# Patient Record
Sex: Female | Born: 1980 | Marital: Married | Smoking: Never smoker
Health system: Southern US, Community
[De-identification: ages and names within clinical notes are randomized; demographics above are authoritative.]

## PROBLEM LIST (undated history)

## (undated) DIAGNOSIS — E119 Type 2 diabetes mellitus without complications: Secondary | ICD-10-CM

## (undated) DIAGNOSIS — K7689 Other specified diseases of liver: Secondary | ICD-10-CM

## (undated) HISTORY — PX: HYSTERECTOMY: SHX81

## (undated) HISTORY — DX: Other specified diseases of liver: K76.89

---

## 2022-02-15 ENCOUNTER — Encounter: Payer: Self-pay | Admitting: Internal Medicine

## 2022-02-15 ENCOUNTER — Ambulatory Visit (INDEPENDENT_AMBULATORY_CARE_PROVIDER_SITE_OTHER): Payer: Medicaid Other | Admitting: Internal Medicine

## 2022-02-15 ENCOUNTER — Other Ambulatory Visit: Payer: Self-pay

## 2022-02-15 ENCOUNTER — Ambulatory Visit (LOCAL_COMMUNITY_HEALTH_CENTER): Payer: Medicaid Other

## 2022-02-15 VITALS — BP 98/64 | HR 76 | Temp 97.6°F | Ht 62.21 in | Wt 209.4 lb

## 2022-02-15 DIAGNOSIS — N939 Abnormal uterine and vaginal bleeding, unspecified: Secondary | ICD-10-CM

## 2022-02-15 DIAGNOSIS — Z131 Encounter for screening for diabetes mellitus: Secondary | ICD-10-CM

## 2022-02-15 DIAGNOSIS — Z136 Encounter for screening for cardiovascular disorders: Secondary | ICD-10-CM | POA: Diagnosis not present

## 2022-02-15 DIAGNOSIS — Z23 Encounter for immunization: Secondary | ICD-10-CM

## 2022-02-15 DIAGNOSIS — R7989 Other specified abnormal findings of blood chemistry: Secondary | ICD-10-CM

## 2022-02-15 DIAGNOSIS — Z1329 Encounter for screening for other suspected endocrine disorder: Secondary | ICD-10-CM | POA: Diagnosis not present

## 2022-02-15 DIAGNOSIS — Z719 Counseling, unspecified: Secondary | ICD-10-CM

## 2022-02-15 LAB — URINALYSIS, ROUTINE W REFLEX MICROSCOPIC
Bilirubin, UA: NEGATIVE
Ketones, UA: NEGATIVE
Leukocytes,UA: NEGATIVE
Nitrite, UA: NEGATIVE
Protein,UA: NEGATIVE
RBC, UA: NEGATIVE
Specific Gravity, UA: 1.015 (ref 1.005–1.030)
Urobilinogen, Ur: 0.2 mg/dL (ref 0.2–1.0)
pH, UA: 5.5 (ref 5.0–7.5)

## 2022-02-15 LAB — BAYER DCA HB A1C WAIVED: HB A1C (BAYER DCA - WAIVED): 8.8 % — ABNORMAL HIGH (ref 4.8–5.6)

## 2022-02-15 MED ORDER — METFORMIN HCL 500 MG PO TABS: 1000.0000 mg | ORAL_TABLET | Freq: Two times a day (BID) | ORAL | 3 refills | Status: DC

## 2022-02-15 MED ORDER — EMPAGLIFLOZIN 10 MG PO TABS: 10.0000 mg | ORAL_TABLET | Freq: Every day | ORAL | 6 refills | Status: DC

## 2022-02-15 NOTE — Patient Instructions (Addendum)
BMI for Adults What is BMI? Body mass index (BMI) is a number that is calculated from a person's weight and height. BMI can help estimate how much of a person's weight is composed of fat. BMI does not measure body fat directly. Rather, it is an alternative to procedures that directly measure body fat, which can be difficult and expensive. BMI can help identify people who may be at higher risk for certain medical problems. What are BMI measurements used for? BMI is used as a screening tool to identify possible weight problems. It helps determine whether a person is obese, overweight, a healthy weight, or underweight. BMI is useful for: Identifying a weight problem that may be related to a medical condition or may increase the risk for medical problems. Promoting changes, such as changes in diet and exercise, to help reach a healthy weight. BMI screening can be repeated to see if these changes are working. How is BMI calculated? BMI involves measuring your weight in relation to your height. Both height and weight are measured, and the BMI is calculated from those numbers. This can be done either in [place] ([place] or metric measurements. Note that charts and online BMI calculators are available to help you find your BMI quickly and easily without having to do these calculations yourself. To calculate your BMI in [place] ([place] measurements:  Measure your weight in pounds (lb). Multiply the number of pounds by 703. For example, for a person who weighs 180 lb, multiply that number by 703, which equals 126,540. Measure your height in inches. Then multiply that number by itself to get a measurement called "inches squared." For example, for a person who is 70 inches tall, the "inches squared" measurement is 70 inches x 70 inches, which equals 4,900 inches squared. Divide the total from step 2 (number of lb x 703) by the total from step 3 (inches squared): 126,540  4,900 = 25.8. This is your BMI. To  calculate your BMI in metric measurements: Measure your weight in kilograms (kg). Measure your height in meters (m). Then multiply that number by itself to get a measurement called "meters squared." For example, for a person who is 1.75 m tall, the "meters squared" measurement is 1.75 m x 1.75 m, which is equal to 3.1 meters squared. Divide the number of kilograms (your weight) by the meters squared number. In this example: 70  3.1 = 22.6. This is your BMI. What do the results mean? BMI charts are used to identify whether you are underweight, normal weight, overweight, or obese. The following guidelines will be used: Underweight: BMI less than 18.5. Normal weight: BMI between 18.5 and 24.9. Overweight: BMI between 25 and 29.9. Diabetes Mellitus and Exercise Exercising regularly is important for overall health, especially for people who have diabetes mellitus. Exercising is not only about losing weight. It has many other health benefits, such as increasing muscle strength and bone density and reducing body fat and stress. This leads to improved fitness, flexibility, and endurance, all of which result in better overall health. What are the benefits of exercise if I have diabetes? Exercise has many benefits for people with diabetes. They include: Helping to lower and control blood sugar (glucose). Helping the body to respond better to the hormone insulin by improving insulin sensitivity. Reducing how much insulin the body needs. Lowering the risk for heart disease by: Lowering "bad" cholesterol and triglyceride levels. Increasing "good" cholesterol levels. Lowering blood pressure. Lowering blood glucose levels. What is my activity plan?  Your health care provider or certified diabetes educator can help you make a plan for the type and frequency of exercise that works for you. This is called your activity plan. Be sure to: Get at least 150 minutes of medium-intensity or high-intensity exercise  each week. Exercises may include brisk walking, biking, or water aerobics. Do stretching and strengthening exercises, such as yoga or weight lifting, at least 2 times a week. Spread out your activity over at least 3 days of the week. Get some form of physical activity each day. Do not go more than 2 days in a row without some kind of physical activity. Avoid being inactive for more than 90 minutes at a time. Take frequent breaks to walk or stretch. Choose exercises or activities that you enjoy. Set realistic goals. Start slowly and gradually increase your exercise intensity over time. How do I manage my diabetes during exercise? Monitor your blood glucose Check your blood glucose before and after exercising. If your blood glucose is: 240 mg/dL (13.3 mmol/L) or higher before you exercise, check your urine for ketones. These are chemicals created by the liver. If you have ketones in your urine, do not exercise until your blood glucose returns to normal. 100 mg/dL (5.6 mmol/L) or lower, eat a snack containing 15-20 grams of carbohydrate. Check your blood glucose 15 minutes after the snack to make sure that your glucose level is above 100 mg/dL (5.6 mmol/L) before you start your exercise. Know the symptoms of low blood glucose (hypoglycemia) and how to treat it. Your risk for hypoglycemia increases during and after exercise. Follow these tips and your health care provider's instructions Keep a carbohydrate snack that is fast-acting for use before, during, and after exercise to help prevent or treat hypoglycemia. Avoid injecting insulin into areas of the body that are going to be exercised. For example, avoid injecting insulin into: Your arms, when you are about to play tennis. Your legs, when you are about to go jogging. Keep records of your exercise habits. Doing this can help you and your health care provider adjust your diabetes management plan as needed. Write down: Food that you eat before and  after you exercise. Blood glucose levels before and after you exercise. The type and amount of exercise you have done. Work with your health care provider when you start a new exercise or activity. He or she may need to: Make sure that the activity is safe for you. Adjust your insulin, other medicines, and food that you eat. Drink plenty of water while you exercise. This prevents loss of water (dehydration) and problems caused by a lot of heat in the body (heat stroke). Where to find more information American Diabetes Association: www.diabetes.org Summary Exercising regularly is important for overall health, especially for people who have diabetes mellitus. Exercising has many health benefits. It increases muscle strength and bone density and reduces body fat and stress. It also lowers and controls blood glucose. Your health care provider or certified diabetes educator can help you make an activity plan for the type and frequency of exercise that works for you. Work with your health care provider to make sure any new activity is safe for you. Also work with your health care provider to adjust your insulin, other medicines, and the food you eat. This information is not intended to replace advice given to you by your health care provider. Make sure you discuss any questions you have with your health care provider. Document Revised: 09/07/2019 Document Reviewed:  09/07/2019 Elsevier Patient Education  2022 [place] for Diabetes Mellitus, Adult Carbohydrate counting is a method of keeping track of how many carbohydrates you eat. Eating carbohydrates increases the amount of sugar (glucose) in the blood. Counting how many carbohydrates you eat improves how well you manage your blood glucose. This, in turn, helps you manage your diabetes. Carbohydrates are measured in grams (g) per serving. It is important to know how many carbohydrates (in grams or by serving size) you can  have in each meal. This is different for every person. A dietitian can help you make a meal plan and calculate how many carbohydrates you should have at each meal and snack. What foods contain carbohydrates? Carbohydrates are found in the following foods: Grains, such as breads and cereals. Dried beans and soy products. Starchy vegetables, such as potatoes, peas, and corn. Fruit and fruit juices. Milk and yogurt. Sweets and snack foods, such as cake, cookies, candy, chips, and soft drinks. How do I count carbohydrates in foods? There are two ways to count carbohydrates in food. You can read food labels or learn standard serving sizes of foods. You can use either of these methods or a combination of both. Using the Nutrition Facts label The Nutrition Facts list is included on the labels of almost all packaged foods and beverages in the [place]. It includes: The serving size. Information about nutrients in each serving, including the grams of carbohydrate per serving. To use the Nutrition Facts, decide how many servings you will have. Then, multiply the number of servings by the number of carbohydrates per serving. The resulting number is the total grams of carbohydrates that you will be having. Learning the standard serving sizes of foods When you eat carbohydrate foods that are not packaged or do not include Nutrition Facts on the label, you need to measure the servings in order to count the grams of carbohydrates. Measure the foods that you will eat with a food scale or measuring cup, if needed. Decide how many standard-size servings you will eat. Multiply the number of servings by 15. For foods that contain carbohydrates, one serving equals 15 g of carbohydrates. For example, if you eat 2 cups or 10 oz (300 g) of strawberries, you will have eaten 2 servings and 30 g of carbohydrates (2 servings x 15 g = 30 g). For foods that have more than one food mixed, such as soups and casseroles,  you must count the carbohydrates in each food that is included. The following list contains standard serving sizes of common carbohydrate-rich foods. Each of these servings has about 15 g of carbohydrates: 1 slice of bread. 1 six-inch (15 cm) tortilla. ? cup or 2 oz (53 g) cooked rice or pasta.  cup or 3 oz (85 g) cooked or canned, drained and rinsed beans or lentils.  cup or 3 oz (85 g) starchy vegetable, such as peas, corn, or squash.  cup or 4 oz (120 g) hot cereal.  cup or 3 oz (85 g) boiled or mashed potatoes, or  or 3 oz (85 g) of a large baked potato.  cup or 4 fl oz (118 mL) fruit juice. 1 cup or 8 fl oz (237 mL) milk. 1 small or 4 oz (106 g) apple.  or 2 oz (63 g) of a medium banana. 1 cup or 5 oz (150 g) strawberries. 3 cups or 1 oz (28.3 g) popped popcorn. What is an example of carbohydrate counting? To calculate the grams  of carbohydrates in this sample meal, follow the steps shown below. Sample meal 3 oz (85 g) chicken breast. ? cup or 4 oz (106 g) brown rice.  cup or 3 oz (85 g) corn. 1 cup or 8 fl oz (237 mL) milk. 1 cup or 5 oz (150 g) strawberries with sugar-free whipped topping. Carbohydrate calculation Identify the foods that contain carbohydrates: Rice. Corn. Milk. Strawberries. Calculate how many servings you have of each food: 2 servings rice. 1 serving corn. 1 serving milk. 1 serving strawberries. Multiply each number of servings by 15 g: 2 servings rice x 15 g = 30 g. 1 serving corn x 15 g = 15 g. 1 serving milk x 15 g = 15 g. 1 serving strawberries x 15 g = 15 g. Add together all of the amounts to find the total grams of carbohydrates eaten: 30 g + 15 g + 15 g + 15 g = 75 g of carbohydrates total. What are tips for following this plan? Shopping Develop a meal plan and then make a shopping list. Buy fresh and frozen vegetables, fresh and frozen fruit, dairy, eggs, beans, lentils, and whole grains. Look at food labels. Choose foods that have  more fiber and less sugar. Avoid processed foods and foods with added sugars. Meal planning Aim to have the same number of grams of carbohydrates at each meal and for each snack time. Plan to have regular, balanced meals and snacks. Where to find more information American Diabetes Association: diabetes.org Centers for Disease Control and Prevention: Store[employer]com.[place] Academy of Nutrition and Dietetics: eatright.org Association of Diabetes Care & Education Specialists: diabeteseducator.org Summary Carbohydrate counting is a method of keeping track of how many carbohydrates you eat. Eating carbohydrates increases the amount of sugar (glucose) in your blood. Counting how many carbohydrates you eat improves how well you manage your blood glucose. This helps you manage your diabetes. A dietitian can help you make a meal plan and calculate how many carbohydrates you should have at each meal and snack. This information is not intended to replace advice given to you by your health care provider. Make sure you discuss any questions you have with your health care provider. Document Revised: 07/13/2020 Document Reviewed: 07/13/2020 Elsevier Patient Education  [place]. Diabetes Mellitus and Nutrition, Adult When you have diabetes, or diabetes mellitus, it is very important to have healthy eating habits because your blood sugar (glucose) levels are greatly affected by what you eat and drink. Eating healthy foods in the right amounts, at about the same times every day, can help you: Manage your blood glucose. Lower your risk of heart disease. Improve your blood pressure. Reach or maintain a healthy weight. What can affect my meal plan? Every person with diabetes is different, and each person has different needs for a meal plan. Your health care provider may recommend that you work with a dietitian to make a meal plan that is best for you. Your meal plan may vary depending on factors such as: The  calories you need. The medicines you take. Your weight. Your blood glucose, blood pressure, and cholesterol levels. Your activity level. Other health conditions you have, such as heart or kidney disease. How do carbohydrates affect me? Carbohydrates, also called carbs, affect your blood glucose level more than any other type of food. Eating carbs raises the amount of glucose in your blood. It is important to know how many carbs you can safely have in each meal. This is different for every  person. Your dietitian can help you calculate how many carbs you should have at each meal and for each snack. How does alcohol affect me? Alcohol can cause a decrease in blood glucose (hypoglycemia), especially if you use insulin or take certain diabetes medicines by mouth. Hypoglycemia can be a life-threatening condition. Symptoms of hypoglycemia, such as sleepiness, dizziness, and confusion, are similar to symptoms of having too much alcohol. Do not drink alcohol if: Your health care provider tells you not to drink. You are pregnant, may be pregnant, or are planning to become pregnant. If you drink alcohol: Limit how much you have to: 0-1 drink a day for women. 0-2 drinks a day for men. Know how much alcohol is in your drink. In the [place] one drink equals one 12 oz bottle of beer (355 mL), one 5 oz glass of wine (148 mL), or one 1 oz glass of hard liquor (44 mL). Keep yourself hydrated with water, diet soda, or unsweetened iced tea. Keep in mind that regular soda, juice, and other mixers may contain a lot of sugar and must be counted as carbs. What are tips for following this plan? Reading food labels Start by checking the serving size on the Nutrition Facts label of packaged foods and drinks. The number of calories and the amount of carbs, fats, and other nutrients listed on the label are based on one serving of the item. Many items contain more than one serving per package. Check the total grams (g) of  carbs in one serving. Check the number of grams of saturated fats and trans fats in one serving. Choose foods that have a low amount or none of these fats. Check the number of milligrams (mg) of salt (sodium) in one serving. Most people should limit total sodium intake to less than 2,300 mg per day. Always check the nutrition information of foods labeled as "low-fat" or "nonfat." These foods may be higher in added sugar or refined carbs and should be avoided. Talk to your dietitian to identify your daily goals for nutrients listed on the label. Shopping Avoid buying canned, pre-made, or processed foods. These foods tend to be high in fat, sodium, and added sugar. Shop around the outside edge of the grocery store. This is where you will most often find fresh fruits and vegetables, bulk grains, fresh meats, and fresh dairy products. Cooking Use low-heat cooking methods, such as baking, instead of high-heat cooking methods, such as deep frying. Cook using healthy oils, such as olive, canola, or sunflower oil. Avoid cooking with butter, cream, or high-fat meats. Meal planning Eat meals and snacks regularly, preferably at the same times every day. Avoid going long periods of time without eating. Eat foods that are high in fiber, such as fresh fruits, vegetables, beans, and whole grains. Eat 4-6 oz (112-168 g) of lean protein each day, such as lean meat, chicken, fish, eggs, or tofu. One ounce (oz) (28 g) of lean protein is equal to: 1 oz (28 g) of meat, chicken, or fish. 1 egg.  cup (62 g) of tofu. Eat some foods each day that contain healthy fats, such as avocado, nuts, seeds, and fish. What foods should I eat? Fruits Berries. Apples. Oranges. Peaches. Apricots. Plums. Grapes. Mangoes. Papayas. Pomegranates. Kiwi. Cherries. Vegetables Leafy greens, including lettuce, spinach, kale, chard, collard greens, mustard greens, and cabbage. Beets. Cauliflower. Broccoli. Carrots. Green beans. Tomatoes.  Peppers. Onions. Cucumbers. Brussels sprouts. Grains Whole grains, such as whole-wheat or whole-grain bread, crackers, tortillas, cereal, and  pasta. Unsweetened oatmeal. Quinoa. Brown or wild rice. Meats and other proteins Seafood. Poultry without skin. Lean cuts of poultry and beef. Tofu. Nuts. Seeds. Dairy Low-fat or fat-free dairy products such as milk, yogurt, and cheese. The items listed above may not be a complete list of foods and beverages you can eat and drink. Contact a dietitian for more information. What foods should I avoid? Fruits Fruits canned with syrup. Vegetables Canned vegetables. Frozen vegetables with butter or cream sauce. Grains Refined white flour and flour products such as bread, pasta, snack foods, and cereals. Avoid all processed foods. Meats and other proteins Fatty cuts of meat. Poultry with skin. Breaded or fried meats. Processed meat. Avoid saturated fats. Dairy Full-fat yogurt, cheese, or milk. Beverages Sweetened drinks, such as soda or iced tea. The items listed above may not be a complete list of foods and beverages you should avoid. Contact a dietitian for more information. Questions to ask a health care provider Do I need to meet with a certified diabetes care and education specialist? Do I need to meet with a dietitian? What number can I call if I have questions? When are the best times to check my blood glucose? Where to find more information: American Diabetes Association: diabetes.org Academy of Nutrition and Dietetics: eatright.[employer] of Diabetes and Digestive and Kidney Diseases: [employer]en[employer].is Association of Diabetes Care & Education Specialists: diabeteseducator.org Summary It is important to have healthy eating habits because your blood sugar (glucose) levels are greatly affected by what you eat and drink. It is important to use alcohol carefully. A healthy meal plan will help you manage your blood glucose and lower your  risk of heart disease. Your health care provider may recommend that you work with a dietitian to make a meal plan that is best for you. This information is not intended to replace advice given to you by your health care provider. Make sure you discuss any questions you have with your health care provider. Document Revised: 07/13/2020 Document Reviewed: 07/13/2020 Elsevier Patient Education  2022 [place] these notes in mind: Weight includes both fat and muscle, so someone with a muscular build, such as an athlete, may have a BMI that is higher than 24.9. In cases like these, BMI is not an accurate measure of body fat. To determine if excess body fat is the cause of a BMI of 25 or higher, further assessments may need to be done by a health care provider. BMI is usually interpreted in the same way for men and women. Where to find more information For more information about BMI, including tools to quickly calculate your BMI, go to these websites: Centers for Disease Control and Prevention: http://www.wolf.info/ American Heart Association: www.heart.org National Heart, Lung, and Blood Institute: https://wilson-eaton.com/ Summary Body mass index (BMI) is a number that is calculated from a person's weight and height. BMI may help estimate how much of a person's weight is composed of fat. BMI can help identify those who may be at higher risk for certain medical problems. BMI can be measured using English measurements or metric measurements. BMI charts are used to identify whether you are underweight, normal weight, overweight, or obese. This information is not intended to replace advice given to you by your health care provider. Make sure you discuss any questions you have with your health care provider. Document Revised: 09/02/2019 Document Reviewed: 07/10/2019 Elsevier Patient Education  [place].

## 2022-02-15 NOTE — Progress Notes (Signed)
Ht 5' 2.21" (1.58 m)    Wt 209 lb 6.4 oz (95 kg)    BMI 38.05 kg/m    Subjective:    Patient ID: Catherine Gray, female    DOB: [birth year], 41 y.o.   MRN: [id number]  Chief Complaint  Patient presents with   New Patient (Initial Visit)    HPI: Catherine Gray is a 41 y.o. female  Pt is here to establish care  Diabetes She presents for her follow-up diabetic visit. She has type 2 diabetes mellitus. Her disease course has been worsening. Associated symptoms include foot paresthesias. Pertinent negatives for diabetes include no blurred vision, no chest pain, no fatigue, no foot ulcerations, no polydipsia, no polyphagia, no polyuria, no visual change, no weakness and no weight loss.  Back Pain This is a new problem. The current episode started more than 1 year ago. The problem occurs intermittently. The problem has been gradually worsening since onset. The pain is present in the lumbar spine. Pertinent negatives include no chest pain, weakness or weight loss.   Chief Complaint  Patient presents with   New Patient (Initial Visit)    Relevant past medical, surgical, family and social history reviewed and updated as indicated. Interim medical history since our last visit reviewed. Allergies and medications reviewed and updated.  Review of Systems  Constitutional:  Negative for fatigue and weight loss.  Eyes:  Negative for blurred vision.  Cardiovascular:  Negative for chest pain.  Endocrine: Negative for polydipsia, polyphagia and polyuria.  Musculoskeletal:  Positive for back pain.  Neurological:  Negative for weakness.   Per HPI unless specifically indicated above     Objective:    Ht 5' 2.21" (1.58 m)    Wt 209 lb 6.4 oz (95 kg)    BMI 38.05 kg/m   Wt Readings from Last 3 Encounters:  02/15/22 209 lb 6.4 oz (95 kg)    Physical Exam Vitals and nursing note reviewed.  Constitutional:      General: She is not in acute distress.    Appearance: Normal appearance. She  is not ill-appearing or diaphoretic.  Eyes:     Conjunctiva/sclera: Conjunctivae normal.  Cardiovascular:     Rate and Rhythm: Normal rate and regular rhythm.     Heart sounds: No murmur heard.   No friction rub. No gallop.  Pulmonary:     Effort: Pulmonary effort is normal. No respiratory distress.     Breath sounds: No stridor. No rhonchi.  Abdominal:     General: Abdomen is flat. Bowel sounds are normal.     Palpations: Abdomen is soft.  Skin:    General: Skin is warm and dry.     Coloration: Skin is not jaundiced.     Findings: No erythema.  Neurological:     Mental Status: She is alert.     Cranial Nerves: No cranial nerve deficit.     Sensory: No sensory deficit.    No results found for this or any previous visit.     No current outpatient medications on file.    Assessment & Plan:  1.. Dm a1c at 8.8 nmeeds to start metformin and jardiance for such  Consider GLPs if a1c doenst get better  check HbA1c,  urine  microalbumin  diabetic diet plan given to pt  adviced regarding hypoglycemia and instructions given to pt today on how to prevent and treat the same if it were to occur. pt acknowledges the plan and voices understanding of the same.  exercise plan given and encouraged.   advice diabetic yearly podiatry, ophthalmology , nutritionist , dental check q 6 months   2. Back pain ? Musculoskeletal Will need to take otc NSAids for such Use heat and cold alternately for such    Problem List Items Addressed This Visit   None Visit Diagnoses     Screening for diabetes mellitus    -  Primary   Relevant Orders   CBC with Differential/Platelet   Comprehensive metabolic panel   Lipid panel   Urinalysis, Routine w reflex microscopic   TSH   Bayer DCA Hb A1c Waived (STAT)   Screening for thyroid disorder       Relevant Orders   CBC with Differential/Platelet   Comprehensive metabolic panel   Lipid panel   Urinalysis, Routine w reflex microscopic   TSH   Bayer DCA  Hb A1c Waived (STAT)   Screening, heart disease, ischemic       Relevant Orders   CBC with Differential/Platelet   Comprehensive metabolic panel   Lipid panel   Urinalysis, Routine w reflex microscopic   TSH   Bayer DCA Hb A1c Waived (STAT)        Orders Placed This Encounter  Procedures   CBC with Differential/Platelet   Comprehensive metabolic panel   Lipid panel   Urinalysis, Routine w reflex microscopic   TSH   Bayer DCA Hb A1c Waived (STAT)     No orders of the defined types were placed in this encounter.    Follow up plan: No follow-ups on file.

## 2022-02-15 NOTE — Progress Notes (Signed)
Pt came in for immunization. Pt is eligible to have final dose for Hep B adult. Given vaccine to (R) deltoid and tolerated well. Copy of updated NCIR given to pt. M.Elisabeth Strom, LPN.

## 2022-02-16 ENCOUNTER — Telehealth: Payer: Self-pay

## 2022-02-16 LAB — CBC WITH DIFFERENTIAL/PLATELET
Basophils Absolute: 0 10*3/uL (ref 0.0–0.2)
Basos: 0 %
EOS (ABSOLUTE): 0.1 10*3/uL (ref 0.0–0.4)
Eos: 2 %
Hematocrit: 37.7 % (ref 34.0–46.6)
Hemoglobin: 12.3 g/dL (ref 11.1–15.9)
Immature Grans (Abs): 0 10*3/uL (ref 0.0–0.1)
Immature Granulocytes: 0 %
Lymphocytes Absolute: 2 10*3/uL (ref 0.7–3.1)
Lymphs: 29 %
MCH: 28.1 pg (ref 26.6–33.0)
MCHC: 32.6 g/dL (ref 31.5–35.7)
MCV: 86 fL (ref 79–97)
Monocytes Absolute: 0.5 10*3/uL (ref 0.1–0.9)
Monocytes: 7 %
Neutrophils Absolute: 4.2 10*3/uL (ref 1.4–7.0)
Neutrophils: 62 %
Platelets: 252 10*3/uL (ref 150–450)
RBC: 4.38 x10E6/uL (ref 3.77–5.28)
RDW: 13.5 % (ref 11.7–15.4)
WBC: 6.8 10*3/uL (ref 3.4–10.8)

## 2022-02-16 LAB — COMPREHENSIVE METABOLIC PANEL
ALT: 136 IU/L — ABNORMAL HIGH (ref 0–32)
AST: 97 IU/L — ABNORMAL HIGH (ref 0–40)
Albumin/Globulin Ratio: 1.7 (ref 1.2–2.2)
Albumin: 4.2 g/dL (ref 3.8–4.8)
Alkaline Phosphatase: 99 IU/L (ref 44–121)
BUN/Creatinine Ratio: 18 (ref 9–23)
BUN: 8 mg/dL (ref 6–24)
Bilirubin Total: 0.2 mg/dL (ref 0.0–1.2)
CO2: 22 mmol/L (ref 20–29)
Calcium: 9.2 mg/dL (ref 8.7–10.2)
Chloride: 103 mmol/L (ref 96–106)
Creatinine, Ser: 0.44 mg/dL — ABNORMAL LOW (ref 0.57–1.00)
Globulin, Total: 2.5 g/dL (ref 1.5–4.5)
Glucose: 227 mg/dL — ABNORMAL HIGH (ref 70–99)
Potassium: 4.1 mmol/L (ref 3.5–5.2)
Sodium: 139 mmol/L (ref 134–144)
Total Protein: 6.7 g/dL (ref 6.0–8.5)
eGFR: 125 mL/min/{1.73_m2} (ref >59)

## 2022-02-16 LAB — TSH: TSH: 1.62 u[IU]/mL (ref 0.450–4.500)

## 2022-02-16 NOTE — Progress Notes (Signed)
Pt has a ho a fatty liver elevated liver enzymes prob sec to such if continue to rise migh tneed RUQ [place] and fu with GI.

## 2022-02-16 NOTE — Progress Notes (Signed)
Actually im referring her to GI

## 2022-02-16 NOTE — Telephone Encounter (Signed)
ERROR

## 2022-02-16 NOTE — Addendum Note (Signed)
Addended byCharlynne Cousins on: 02/16/2022 10:31 AM   Modules accepted: Orders

## 2022-02-16 NOTE — Telephone Encounter (Signed)
SCHEDULED FOR 05/08/2022 AT 130 I ALSO REQUESTED INTERP ALSO FOR APPOINTMENT

## 2022-02-16 NOTE — Telephone Encounter (Signed)
PA started for Jardiance 7m tablets through [employer]. Awaiting on determination

## 2022-02-19 ENCOUNTER — Other Ambulatory Visit: Payer: Self-pay | Admitting: Internal Medicine

## 2022-02-19 ENCOUNTER — Telehealth: Payer: Self-pay

## 2022-02-19 NOTE — Telephone Encounter (Signed)
PA  for Jardiance through [employer] [facility] was approved Awaiting on determination

## 2022-02-20 NOTE — Telephone Encounter (Signed)
Requested medications are due for refill today.  no  Requested medications are on the active medications list.  yes  Last refill. 02/15/2022 #60 3 refills  Future visit scheduled.   yes  Notes to clinic.  This medication was already refilled at a different [facility]. Called new pharmacy and asked them to call old pharmacy to get refill. S/W Archivist. Call will be made to transfer rx.  No action needed by practice.    Requested Prescriptions  Pending Prescriptions Disp Refills   metFORMIN (GLUCOPHAGE) 500 MG tablet [Pharmacy Med Name: metFORMIN HCl 500 MG Oral Tablet] 240 tablet 0    Sig: TAKE 2 TABLETS BY MOUTH TWICE DAILY WITH A MEAL     Endocrinology:  Diabetes - Biguanides Failed - 02/19/2022  5:51 PM      Failed - Cr in normal range and within 360 days    Creatinine, Ser  Date Value Ref Range Status  02/15/2022 0.44 (L) 0.57 - 1.00 mg/dL Final          Failed - HBA1C is between 0 and 7.9 and within 180 days    HB A1C (BAYER DCA - WAIVED)  Date Value Ref Range Status  02/15/2022 8.8 (H) 4.8 - 5.6 % Final    Comment:             Prediabetes: 5.7 - 6.4          Diabetes: >6.4          Glycemic control for adults with diabetes: <7.0           Failed - B12 Level in normal range and within 720 days    No results found for: VITAMINB12        Passed - eGFR in normal range and within 360 days    eGFR  Date Value Ref Range Status  02/15/2022 125 >59 mL/min/1.73 Final          Passed - Valid encounter within last 6 months    Recent Outpatient Visits           5 days ago Screening for diabetes mellitus   [facility] Vigg, Avanti, MD       Future Appointments             In 3 weeks Vigg, Avanti, MD [place]            Passed - CBC within normal limits and completed in the last 12 months    WBC  Date Value Ref Range Status  02/15/2022 6.8 3.4 - 10.8 x10E3/uL Final   RBC  Date Value Ref Range Status   02/15/2022 4.38 3.77 - 5.28 x10E6/uL Final   Hemoglobin  Date Value Ref Range Status  02/15/2022 12.3 11.1 - 15.9 g/dL Final   Hematocrit  Date Value Ref Range Status  02/15/2022 37.7 34.0 - 46.6 % Final   MCHC  Date Value Ref Range Status  02/15/2022 32.6 31.5 - 35.7 g/dL Final   [facility]  Date Value Ref Range Status  02/15/2022 28.1 26.6 - 33.0 pg Final   MCV  Date Value Ref Range Status  02/15/2022 86 79 - 97 fL Final   No results found for: PLTCOUNTKUC, LABPLAT, POCPLA RDW  Date Value Ref Range Status  02/15/2022 13.5 11.7 - 15.4 % Final

## 2022-03-08 ENCOUNTER — Other Ambulatory Visit: Payer: Medicaid Other

## 2022-03-08 ENCOUNTER — Other Ambulatory Visit: Payer: Self-pay

## 2022-03-09 ENCOUNTER — Encounter: Payer: Self-pay | Admitting: Obstetrics and Gynecology

## 2022-03-12 ENCOUNTER — Other Ambulatory Visit: Payer: Self-pay

## 2022-03-12 ENCOUNTER — Other Ambulatory Visit: Payer: Medicaid Other

## 2022-03-12 DIAGNOSIS — Z1329 Encounter for screening for other suspected endocrine disorder: Secondary | ICD-10-CM

## 2022-03-12 DIAGNOSIS — R7989 Other specified abnormal findings of blood chemistry: Secondary | ICD-10-CM

## 2022-03-12 DIAGNOSIS — Z136 Encounter for screening for cardiovascular disorders: Secondary | ICD-10-CM

## 2022-03-12 DIAGNOSIS — Z131 Encounter for screening for diabetes mellitus: Secondary | ICD-10-CM

## 2022-03-12 LAB — BAYER DCA HB A1C WAIVED: HB A1C (BAYER DCA - WAIVED): 7.6 % — ABNORMAL HIGH (ref 4.8–5.6)

## 2022-03-13 LAB — COMPREHENSIVE METABOLIC PANEL
ALT: 80 IU/L — ABNORMAL HIGH (ref 0–32)
AST: 60 IU/L — ABNORMAL HIGH (ref 0–40)
Albumin/Globulin Ratio: 1.6 (ref 1.2–2.2)
Albumin: 4.3 g/dL (ref 3.8–4.8)
Alkaline Phosphatase: 101 IU/L (ref 44–121)
BUN/Creatinine Ratio: 14 (ref 9–23)
BUN: 8 mg/dL (ref 6–24)
Bilirubin Total: 0.4 mg/dL (ref 0.0–1.2)
CO2: 23 mmol/L (ref 20–29)
Calcium: 9.1 mg/dL (ref 8.7–10.2)
Chloride: 104 mmol/L (ref 96–106)
Creatinine, Ser: 0.57 mg/dL (ref 0.57–1.00)
Globulin, Total: 2.7 g/dL (ref 1.5–4.5)
Glucose: 144 mg/dL — ABNORMAL HIGH (ref 70–99)
Potassium: 4.6 mmol/L (ref 3.5–5.2)
Sodium: 139 mmol/L (ref 134–144)
Total Protein: 7 g/dL (ref 6.0–8.5)
eGFR: 117 mL/min/{1.73_m2} (ref >59)

## 2022-03-13 LAB — CBC WITH DIFFERENTIAL/PLATELET
Basophils Absolute: 0 10*3/uL (ref 0.0–0.2)
Basos: 1 %
EOS (ABSOLUTE): 0.1 10*3/uL (ref 0.0–0.4)
Eos: 2 %
Hematocrit: 37.6 % (ref 34.0–46.6)
Hemoglobin: 12.6 g/dL (ref 11.1–15.9)
Immature Grans (Abs): 0 10*3/uL (ref 0.0–0.1)
Immature Granulocytes: 0 %
Lymphocytes Absolute: 1.7 10*3/uL (ref 0.7–3.1)
Lymphs: 29 %
MCH: 29.3 pg (ref 26.6–33.0)
MCHC: 33.5 g/dL (ref 31.5–35.7)
MCV: 87 fL (ref 79–97)
Monocytes Absolute: 0.3 10*3/uL (ref 0.1–0.9)
Monocytes: 5 %
Neutrophils Absolute: 3.8 10*3/uL (ref 1.4–7.0)
Neutrophils: 63 %
Platelets: 244 10*3/uL (ref 150–450)
RBC: 4.3 x10E6/uL (ref 3.77–5.28)
RDW: 14 % (ref 11.7–15.4)
WBC: 6 10*3/uL (ref 3.4–10.8)

## 2022-03-13 LAB — LIPID PANEL
Chol/HDL Ratio: 5 ratio — ABNORMAL HIGH (ref 0.0–4.4)
Cholesterol, Total: 161 mg/dL (ref 100–199)
HDL: 32 mg/dL — ABNORMAL LOW (ref >39)
LDL Chol Calc (NIH): 98 mg/dL (ref 0–99)
Triglycerides: 180 mg/dL — ABNORMAL HIGH (ref 0–149)
VLDL Cholesterol Cal: 31 mg/dL (ref 5–40)

## 2022-03-13 LAB — TSH: TSH: 1.51 u[IU]/mL (ref 0.450–4.500)

## 2022-03-15 ENCOUNTER — Ambulatory Visit: Payer: Medicaid Other | Admitting: Internal Medicine

## 2022-03-19 ENCOUNTER — Ambulatory Visit (INDEPENDENT_AMBULATORY_CARE_PROVIDER_SITE_OTHER): Payer: Medicaid Other | Admitting: Internal Medicine

## 2022-03-19 ENCOUNTER — Encounter: Payer: Self-pay | Admitting: Internal Medicine

## 2022-03-19 ENCOUNTER — Other Ambulatory Visit: Payer: Self-pay

## 2022-03-19 VITALS — BP 109/71 | HR 91 | Temp 97.5°F | Ht 62.21 in | Wt 208.4 lb

## 2022-03-19 DIAGNOSIS — R7989 Other specified abnormal findings of blood chemistry: Secondary | ICD-10-CM | POA: Insufficient documentation

## 2022-03-19 DIAGNOSIS — N939 Abnormal uterine and vaginal bleeding, unspecified: Secondary | ICD-10-CM | POA: Diagnosis not present

## 2022-03-19 DIAGNOSIS — E119 Type 2 diabetes mellitus without complications: Secondary | ICD-10-CM

## 2022-03-19 MED ORDER — EMPAGLIFLOZIN 10 MG PO TABS: 10.0000 mg | ORAL_TABLET | Freq: Every day | ORAL | 6 refills | Status: DC

## 2022-03-19 MED ORDER — FENOFIBRATE 145 MG PO TABS: 145.0000 mg | ORAL_TABLET | Freq: Every day | ORAL | 5 refills | Status: AC

## 2022-03-19 NOTE — Progress Notes (Addendum)
? ?BP 109/71   Pulse 91   Temp (!) 97.5 ?F (36.4 ?C) (Oral)   Ht 5' 2.21" (1.58 m)   Wt 208 lb 6.4 oz (94.5 kg)   SpO2 98%   BMI 37.87 kg/m?   ? ?Subjective:  ? ? Patient ID: Catherine Gray, female    DOB: 1981-01-03, 41 y.o.   MRN: [id number] ? ?Chief Complaint  ?Patient presents with  ? Diabetes  ? ? ?HPI: ?Catherine Gray is a 41 y.o. female ? ?Due to language barrier, an interpreter was present during the history-taking and subsequent discussion (and for part of the physical exam) with this patient. ?Pt was seen with her husbnad she hasnt been to ob gyn or GI there are notes that they contacted her but were unable to connect. Pt does have a ho fatty liver chol is elevated and LFTs are abnl.  ?Hasnt been compliant with diet or exercise ?A1c improved however on metformin didn't take jardiance.  ?  ? ?Diabetes ?She presents for her follow-up diabetic visit. She has type 2 diabetes mellitus. Pertinent negatives for hypoglycemia include no confusion, dizziness, headaches, speech difficulty or tremors. Pertinent negatives for diabetes include no chest pain, no fatigue, no polydipsia, no polyphagia, no polyuria and no weakness.  ? ?Chief Complaint  ?Patient presents with  ? Diabetes  ? ? ?Relevant past medical, surgical, family and social history reviewed and updated as indicated. Interim medical history since our last visit reviewed. ?Allergies and medications reviewed and updated. ? ?Review of Systems  ?Constitutional:  Negative for activity change, appetite change, chills, fatigue and fever.  ?HENT:  Negative for congestion, ear discharge, ear pain and facial swelling.   ?Eyes:  Negative for pain and itching.  ?Respiratory:  Negative for cough, chest tightness, shortness of breath and wheezing.   ?Cardiovascular:  Negative for chest pain, palpitations and leg swelling.  ?Gastrointestinal:  Negative for abdominal distention, abdominal pain, blood in stool, constipation, diarrhea, nausea and vomiting.   ?Endocrine: Negative for cold intolerance, heat intolerance, polydipsia, polyphagia and polyuria.  ?Genitourinary:  Negative for difficulty urinating, dysuria, flank pain, frequency, hematuria and urgency.  ?Musculoskeletal:  Negative for arthralgias, gait problem, joint swelling and myalgias.  ?Skin:  Negative for color change, rash and wound.  ?Neurological:  Negative for dizziness, tremors, speech difficulty, weakness, light-headedness, numbness and headaches.  ?Hematological:  Does not bruise/bleed easily.  ?Psychiatric/Behavioral:  Negative for agitation, confusion, decreased concentration, sleep disturbance and suicidal ideas.   ? ?Per HPI unless specifically indicated above ? ?   ?Objective:  ?  ?BP 109/71   Pulse 91   Temp (!) 97.5 ?F (36.4 ?C) (Oral)   Ht 5' 2.21" (1.58 m)   Wt 208 lb 6.4 oz (94.5 kg)   SpO2 98%   BMI 37.87 kg/m?   ?Wt Readings from Last 3 Encounters:  ?03/19/22 208 lb 6.4 oz (94.5 kg)  ?02/15/22 209 lb 6.4 oz (95 kg)  ?  ?Physical Exam ?Vitals and nursing note reviewed.  ?Constitutional:   ?   General: She is not in acute distress. ?   Appearance: Normal appearance. She is not ill-appearing or diaphoretic.  ?Pulmonary:  ?   Breath sounds: No rhonchi.  ?Skin: ?   General: Skin is warm and dry.  ?   Coloration: Skin is not jaundiced.  ?   Findings: No erythema.  ?Neurological:  ?   Mental Status: She is alert.  ?Psychiatric:     ?   Mood and Affect: Mood  normal.     ?   Behavior: Behavior normal.     ?   Thought Content: Thought content normal.     ?   Judgment: Judgment normal.  ? ? ?Results for orders placed or performed in visit on 03/12/22  ?Lipid panel  ?Result Value Ref Range  ? Cholesterol, Total 161 100 - 199 mg/dL  ? Triglycerides 180 (H) 0 - 149 mg/dL  ? HDL 32 (L) >39 mg/dL  ? VLDL Cholesterol Cal 31 5 - 40 mg/dL  ? LDL Chol Calc (NIH) 98 0 - 99 mg/dL  ? Chol/HDL Ratio 5.0 (H) 0.0 - 4.4 ratio  ?Comprehensive metabolic panel  ?Result Value Ref Range  ? Glucose 144 (H) 70 -  99 mg/dL  ? BUN 8 6 - 24 mg/dL  ? Creatinine, Ser 0.57 0.57 - 1.00 mg/dL  ? eGFR 117 >59 mL/min/1.73  ? BUN/Creatinine Ratio 14 9 - 23  ? Sodium 139 134 - 144 mmol/L  ? Potassium 4.6 3.5 - 5.2 mmol/L  ? Chloride 104 96 - 106 mmol/L  ? CO2 23 20 - 29 mmol/L  ? Calcium 9.1 8.7 - 10.2 mg/dL  ? Total Protein 7.0 6.0 - 8.5 g/dL  ? Albumin 4.3 3.8 - 4.8 g/dL  ? Globulin, Total 2.7 1.5 - 4.5 g/dL  ? Albumin/Globulin Ratio 1.6 1.2 - 2.2  ? Bilirubin Total 0.4 0.0 - 1.2 mg/dL  ? Alkaline Phosphatase 101 44 - 121 IU/L  ? AST 60 (H) 0 - 40 IU/L  ? ALT 80 (H) 0 - 32 IU/L  ?CBC with Differential/Platelet  ?Result Value Ref Range  ? WBC 6.0 3.4 - 10.8 x10E3/uL  ? RBC 4.30 3.77 - 5.28 x10E6/uL  ? Hemoglobin 12.6 11.1 - 15.9 g/dL  ? Hematocrit 37.6 34.0 - 46.6 %  ? MCV 87 79 - 97 fL  ? MCH 29.3 26.6 - 33.0 pg  ? MCHC 33.5 31.5 - 35.7 g/dL  ? RDW 14.0 11.7 - 15.4 %  ? Platelets 244 150 - 450 x10E3/uL  ? Neutrophils 63 Not Estab. %  ? Lymphs 29 Not Estab. %  ? Monocytes 5 Not Estab. %  ? Eos 2 Not Estab. %  ? Basos 1 Not Estab. %  ? Neutrophils Absolute 3.8 1.4 - 7.0 x10E3/uL  ? Lymphocytes Absolute 1.7 0.7 - 3.1 x10E3/uL  ? Monocytes Absolute 0.3 0.1 - 0.9 x10E3/uL  ? EOS (ABSOLUTE) 0.1 0.0 - 0.4 x10E3/uL  ? Basophils Absolute 0.0 0.0 - 0.2 x10E3/uL  ? Immature Granulocytes 0 Not Estab. %  ? Immature Grans (Abs) 0.0 0.0 - 0.1 x10E3/uL  ?Bayer DCA Hb A1c Waived (STAT)  ?Result Value Ref Range  ? HB A1C (BAYER DCA - WAIVED) 7.6 (H) 4.8 - 5.6 %  ?TSH  ?Result Value Ref Range  ? TSH 1.510 0.450 - 4.500 uIU/mL  ? ?   ? ? ?Current Outpatient Medications:  ?  fenofibrate (TRICOR) 145 MG tablet, Take 1 tablet (145 mg total) by mouth daily., Disp: 30 tablet, Rfl: 5 ?  ibuprofen (ADVIL) 800 MG tablet, Take 800 mg by mouth every 6 (six) hours as needed., Disp: , Rfl:  ?  metFORMIN (GLUCOPHAGE) 500 MG tablet, Take 2 tablets (1,000 mg total) by mouth 2 (two) times daily with a meal., Disp: 60 tablet, Rfl: 3 ?  empagliflozin (JARDIANCE) 10 MG  TABS tablet, Take 1 tablet (10 mg total) by mouth daily., Disp: 30 tablet, Rfl: 6  ? ? ?Assessment & Plan:  ?  DM a1c - on 7.6  ?Is on metformin , not taking jardiance.  ?check HbA1c,  urine  microalbumin  diabetic diet plan given to pt  adviced regarding hypoglycemia and instructions given to pt today on how to prevent and treat the same if it were to occur. pt acknowledges the plan and voices understanding of the same.  exercise plan given and encouraged.   advice diabetic yearly podiatry, ophthalmology , nutritionist , dental check q 6 months, ? ?2. Uterine / vaginal issues: ? Referral done in February pt wasn't able to get appt.  ? ? ?3. Elevated LFT'S  fatty liver elevated liver enzymes prob sec to such if continue to rise migh tneed RUQ [place] and fu with GI.  ? ? ?Problem List Items Addressed This Visit   ? ?  ? Endocrine  ? Diabetes mellitus without complication ([place])  ? Relevant Medications  ? empagliflozin (JARDIANCE) 10 MG TABS tablet  ?  ? Other  ? Vaginal bleeding - Primary  ? Relevant Orders  ? Ambulatory referral to Obstetrics / Gynecology  ? Elevated LFTs  ? Relevant Orders  ? Ambulatory referral to Gastroenterology  ?  ? ?Orders Placed This Encounter  ?Procedures  ? Ambulatory referral to Obstetrics / Gynecology  ? Ambulatory referral to Gastroenterology  ?  ? ?Meds ordered this encounter  ?Medications  ? fenofibrate (TRICOR) 145 MG tablet  ?  Sig: Take 1 tablet (145 mg total) by mouth daily.  ?  Dispense:  30 tablet  ?  Refill:  5  ? empagliflozin (JARDIANCE) 10 MG TABS tablet  ?  Sig: Take 1 tablet (10 mg total) by mouth daily.  ?  Dispense:  30 tablet  ?  Refill:  6  ?  ? ?Follow up plan: ?Return in about 4 months (around 07/19/2022). ? ? ?

## 2022-03-19 NOTE — Patient Instructions (Signed)

## 2022-05-07 ENCOUNTER — Ambulatory Visit (LOCAL_COMMUNITY_HEALTH_CENTER): Payer: Medicaid Other

## 2022-05-07 DIAGNOSIS — Z719 Counseling, unspecified: Secondary | ICD-10-CM

## 2022-05-07 DIAGNOSIS — Z23 Encounter for immunization: Secondary | ICD-10-CM | POA: Diagnosis not present

## 2022-05-07 NOTE — Progress Notes (Signed)
Patient in Nurse Clinic for immunization.  Patient received Tdap in left deltoid and tolerated well. Interpreter # [phone] used and husband was able to understand some English also. NCIR updated and provided to patient and vaccinations due to recommended were explained to patien'ts husband. ?

## 2022-05-08 ENCOUNTER — Other Ambulatory Visit: Payer: Self-pay | Admitting: Gastroenterology

## 2022-05-08 ENCOUNTER — Encounter: Payer: Self-pay | Admitting: Gastroenterology

## 2022-05-08 ENCOUNTER — Other Ambulatory Visit: Payer: Self-pay

## 2022-05-08 ENCOUNTER — Ambulatory Visit (INDEPENDENT_AMBULATORY_CARE_PROVIDER_SITE_OTHER): Payer: Medicaid Other | Admitting: Gastroenterology

## 2022-05-08 VITALS — BP 119/71 | HR 80 | Temp 98.3°F | Ht 62.21 in | Wt 202.8 lb

## 2022-05-08 DIAGNOSIS — R7989 Other specified abnormal findings of blood chemistry: Secondary | ICD-10-CM

## 2022-05-08 DIAGNOSIS — E6609 Other obesity due to excess calories: Secondary | ICD-10-CM | POA: Diagnosis not present

## 2022-05-08 DIAGNOSIS — Z6836 Body mass index (BMI) 36.0-36.9, adult: Secondary | ICD-10-CM | POA: Diagnosis not present

## 2022-05-08 NOTE — Progress Notes (Signed)
?  ?Jonathon Bellows MD, MRCP(U.K) ?[address]  ?[address]  ?[place], [place] [address]  ?Main: [phone]  ?Fax: [phone] ? ? ?Gastroenterology Consultation ? ?Referring Provider:     Charlynne Cousins, MD ?Primary Care Physician:  Charlynne Cousins, MD ?Primary Gastroenterologist:  Dr. Jonathon Bellows  ?Reason for Consultation:    abnormal LFT's ?      ? HPI:   ?Catherine Gray is a 41 y.o. y/o female referred for consultation & management  by Dr. Charlynne Cousins, MD.   ? ? ? ?First noted abnormality in LFT's in was noted 2 months back.  She says that she was told 1 year back when she was in [place] that her LFTs were elevated.  Was told it was due to fatty liver disease. ?Alcohol use none  ?Drug use none ?Over the counter herbal supplements none ?New medications none ?Abdominal pain none ?Tattoos none ?Military service none ?Prior blood transfusion none ?Incarceration none ?History of travel originally from [place] was in [place] now with [place] ?Family history of liver disease yes due to fatty liver disease ?Recent change in weight: Yes this is the heaviest she has weighed.  She also suffers from diabetes and hypertension ?Marland Kitchen  Patient speaks only [place] and visit conducted via interpreter ? ? ?  Latest Ref Rng & Units 03/12/2022  ? 10:31 AM 02/15/2022  ?  1:26 PM  ?Hepatic Function  ?Total Protein 6.0 - 8.5 g/dL 7.0   6.7    ?Albumin 3.8 - 4.8 g/dL 4.3   4.2    ?AST 0 - 40 IU/L 60   97    ?ALT 0 - 32 IU/L 80   136    ?Alk Phosphatase 44 - 121 IU/L 101   99    ?Total Bilirubin 0.0 - 1.2 mg/dL 0.4   0.2    ?  ? ?No past medical history on file. ? ?No past surgical history on file. ? ?Prior to Admission medications   ?Medication Sig Start Date End Date Taking? Authorizing Provider  ?empagliflozin (JARDIANCE) 10 MG TABS tablet Take 1 tablet (10 mg total) by mouth daily. 03/19/22   Vigg, Avanti, MD  ?fenofibrate (TRICOR) 145 MG tablet Take 1 tablet (145 mg total) by mouth daily. 03/19/22   Vigg, Avanti, MD  ?ibuprofen  (ADVIL) 800 MG tablet Take 1 tablet by mouth as needed. 01/23/22   [provider]  ?metFORMIN (GLUCOPHAGE) 500 MG tablet Take 2 tablets (1,000 mg total) by mouth 2 (two) times daily with a meal. 02/15/22   Charlynne Cousins, MD  ? ? ?No family history on file.  ? ?Social History  ? ?Tobacco Use  ? Smoking status: Never  ? Smokeless tobacco: Never  ?Vaping Use  ? Vaping Use: Never used  ?Substance Use Topics  ? Alcohol use: Never  ? Drug use: Never  ? ? ?Allergies as of 05/08/2022  ? (No Known Allergies)  ? ? ?Review of Systems:    ?All systems reviewed and negative except where noted in HPI. ? ? Physical Exam:  ?LMP 04/23/2022  ?Patient's last menstrual period was 04/23/2022. ?Psych:  Alert and cooperative. Normal mood and affect. ?General:   Alert,  Well-developed, well-nourished, pleasant and cooperative in NAD ?Head:  Normocephalic and atraumatic. ?Eyes:  Sclera clear, no icterus.   Conjunctiva pink. ?Psych:  Alert and cooperative. Normal mood and affect. ? ?Imaging Studies: ?No results found. ? ?Assessment and Plan:  ? ?Catherine Gray is a 41 y.o. y/o female has  been referred for abnormal LFTs.  Very likely has metabolic syndrome and fatty liver disease nonalcoholic. ? ?Plan ?1.  Counseled about weight loss, addressing cardiovascular risk factors control of sugars screening for hypertension healthy eating patient information about Mediterranean diet is provided.  Discussed about portion sizes.  Advised to lose some weight.  Advised that the new drop of fatty liver disease is likely to be approved in September and we can discuss it in the future.  Discussed about referral to dietitian she was not keen at this point of time she will try losing weight on her own ? ?2.  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 ?Follow up in 3 months ? ?Dr Jonathon Bellows MD,MRCP(U.K) ? ?

## 2022-05-08 NOTE — Patient Instructions (Signed)
Fatty Liver Disease ? ?The liver converts food into energy, removes toxic material from the blood, makes important proteins, and absorbs necessary vitamins from food. Fatty liver disease occurs when too much fat has built up in your liver cells. Fatty liver disease is also called hepatic steatosis. ?In many cases, fatty liver disease does not cause symptoms or problems. It is often diagnosed when tests are being done for other reasons. However, over time, fatty liver can cause inflammation that may lead to more serious liver problems, such as scarring of the liver (cirrhosis) and liver failure. ?Fatty liver is associated with insulin resistance, increased body fat, high blood pressure (hypertension), and high cholesterol. These are features of metabolic syndrome and increase your risk for stroke, diabetes, and heart disease. ?What are the causes? ?This condition may be caused by components of metabolic syndrome: ?Obesity. ?Insulin resistance. ?High cholesterol. ?Other causes: ?Alcohol abuse. ?Poor nutrition. ?Cushing syndrome. ?Pregnancy. ?Certain drugs. ?Poisons. ?Some viral infections. ?What increases the risk? ?You are more likely to develop this condition if you: ?Abuse alcohol. ?Are overweight. ?Have diabetes. ?Have hepatitis. ?Have a high triglyceride level. ?Are pregnant. ?What are the signs or symptoms? ?Fatty liver disease often does not cause symptoms. If symptoms do develop, they can include: ?Fatigue and weakness. ?Weight loss. ?Confusion. ?Nausea, vomiting, or abdominal pain. ?Yellowing of your skin and the white parts of your eyes (jaundice). ?Itchy skin. ?How is this diagnosed? ?This condition may be diagnosed by: ?A physical exam and your medical history. ?Blood tests. ?Imaging tests, such as an ultrasound, CT scan, or MRI. ?A liver biopsy. A small sample of liver tissue is removed using a needle. The sample is then looked at under a microscope. ?How is this treated? ?Fatty liver disease is often  caused by other health conditions. Treatment for fatty liver may involve medicines and lifestyle changes to manage conditions such as: ?Alcoholism. ?High cholesterol. ?Diabetes. ?Being overweight or obese. ?Follow these instructions at home: ? ?Do not drink alcohol. If you have trouble quitting, ask your health care provider how to safely quit with the help of medicine or a supervised program. This is important to keep your condition from getting worse. ?Eat a healthy diet as told by your health care provider. Ask your health care provider about working with a dietitian to develop an eating plan. ?Exercise regularly. This can help you lose weight and control your cholesterol and diabetes. Talk to your health care provider about an exercise plan and which activities are best for you. ?Take over-the-counter and prescription medicines only as told by your health care provider. ?Keep all follow-up visits. This is important. ?Contact a health care provider if: ?You have trouble controlling your: ?Blood sugar. This is especially important if you have diabetes. ?Cholesterol. ?Drinking of alcohol. ?Get help right away if: ?You have abdominal pain. ?You have jaundice. ?You have nausea and are vomiting. ?You vomit blood or material that looks like coffee grounds. ?You have stools that are black, tar-like, or bloody. ?Summary ?Fatty liver disease develops when too much fat builds up in the cells of your liver. ?Fatty liver disease often causes no symptoms or problems. However, over time, fatty liver can cause inflammation that may lead to more serious liver problems, such as scarring of the liver (cirrhosis). ?You are more likely to develop this condition if you abuse alcohol, are pregnant, are overweight, have diabetes, have hepatitis, or have high triglyceride or cholesterol levels. ?Contact your health care provider if you have trouble controlling your blood  sugar, cholesterol, or drinking of alcohol. ?This information is  not intended to replace advice given to you by your health care provider. Make sure you discuss any questions you have with your health care provider. ?Document Revised: 09/22/2020 Document Reviewed: 09/22/2020 ?Elsevier Patient Education ? [place]. ?Mediterranean Diet ?A Mediterranean diet refers to food and lifestyle choices that are based on the traditions of countries located on the [employer]. It focuses on eating more fruits, vegetables, whole grains, beans, nuts, seeds, and heart-healthy fats, and eating less dairy, meat, eggs, and processed foods with added sugar, salt, and fat. This way of eating has been shown to help prevent certain conditions and improve outcomes for people who have chronic diseases, like kidney disease and heart disease. ?What are tips for following this plan? ?Reading food labels ?Check the serving size of packaged foods. For foods such as rice and pasta, the serving size refers to the amount of cooked product, not dry. ?Check the total fat in packaged foods. Avoid foods that have saturated fat or trans fats. ?Check the ingredient list for added sugars, such as corn syrup. ?Shopping ? ?Buy a variety of foods that offer a balanced diet, including: ?Fresh fruits and vegetables (produce). ?Grains, beans, nuts, and seeds. Some of these may be available in unpackaged forms or large amounts (in bulk). ?Fresh seafood. ?Poultry and eggs. ?Low-fat dairy products. ?Buy whole ingredients instead of prepackaged foods. ?Buy fresh fruits and vegetables in-season from local farmers markets. ?Buy plain frozen fruits and vegetables. ?If you do not have access to quality fresh seafood, buy precooked frozen shrimp or canned fish, such as tuna, salmon, or sardines. ?Stock your pantry so you always have certain foods on hand, such as olive oil, canned tuna, canned tomatoes, rice, pasta, and beans. ?Cooking ?Cook foods with extra-virgin olive oil instead of using butter or other vegetable  oils. ?Have meat as a side dish, and have vegetables or grains as your main dish. This means having meat in small portions or adding small amounts of meat to foods like pasta or stew. ?Use beans or vegetables instead of meat in common dishes like chili or lasagna. ?Experiment with different cooking methods. Try roasting, broiling, steaming, and saut?ing vegetables. ?Add frozen vegetables to soups, stews, pasta, or rice. ?Add nuts or seeds for added healthy fats and plant protein at each meal. You can add these to yogurt, salads, or vegetable dishes. ?Marinate fish or vegetables using olive oil, lemon juice, garlic, and fresh herbs. ?Meal planning ?Plan to eat one vegetarian meal one day each week. Try to work up to two vegetarian meals, if possible. ?Eat seafood two or more times a week. ?Have healthy snacks readily available, such as: ?Vegetable sticks with hummus. ?[place] yogurt. ?Fruit and nut trail mix. ?Eat balanced meals throughout the week. This includes: ?Fruit: 2-3 servings a day. ?Vegetables: 4-5 servings a day. ?Low-fat dairy: 2 servings a day. ?Fish, poultry, or lean meat: 1 serving a day. ?Beans and legumes: 2 or more servings a week. ?Nuts and seeds: 1-2 servings a day. ?Whole grains: 6-8 servings a day. ?Extra-virgin olive oil: 3-4 servings a day. ?Limit red meat and sweets to only a few servings a month. ?Lifestyle ? ?Cook and eat meals together with your family, when possible. ?Drink enough fluid to keep your urine pale yellow. ?Be physically active every day. This includes: ?Aerobic exercise like running or swimming. ?Leisure activities like gardening, walking, or housework. ?Get 7-8 hours of sleep each night. ?If  recommended by your health care provider, drink red wine in moderation. This means 1 glass a day for nonpregnant women and 2 glasses a day for men. A glass of wine equals 5 oz (150 mL). ?What foods should I eat? ?Fruits ?Apples. Apricots. Avocado. Berries. Bananas. Cherries. Dates. Figs.  Grapes. Lemons. Melon. Oranges. Peaches. Plums. Pomegranate. ?Vegetables ?Artichokes. Beets. Broccoli. Cabbage. Carrots. Eggplant. Green beans. Chard. Kale. Spinach. Onions. Leeks. Peas. Squash. Tomatoes. P

## 2022-05-09 LAB — HEPATITIS C ANTIBODY: Hep C Virus Ab: NONREACTIVE

## 2022-05-10 LAB — NASH FIBROSURE
ALPHA 2-MACROGLOBULINS, QN: 191 mg/dL (ref 110–276)
ALT (SGPT) P5P: 73 IU/L — ABNORMAL HIGH (ref 0–40)
AST (SGOT) P5P: 64 IU/L — ABNORMAL HIGH (ref 0–40)
Apolipoprotein A-1: 110 mg/dL — ABNORMAL LOW (ref 116–209)
Bilirubin, Total: 0.1 mg/dL (ref 0.0–1.2)
Cholesterol, Total: 173 mg/dL (ref 100–199)
Fibrosis Score: 0.08 (ref 0.00–0.21)
GGT: 39 IU/L (ref 0–60)
Glucose: 250 mg/dL — ABNORMAL HIGH (ref 70–99)
Haptoglobin: 112 mg/dL (ref 42–296)
Height: 62 in
NASH Score: 0.75 — ABNORMAL HIGH
Steatosis Score: 0.9 — ABNORMAL HIGH (ref 0.00–0.30)
Triglycerides: 351 mg/dL — ABNORMAL HIGH (ref 0–149)
Weight: 202 [lb_av]

## 2022-05-10 NOTE — Progress Notes (Signed)
Catherine Gray inform fibrosure shows NASH- advise lose weight , reduce fats in diet - Triglycerides elevated - no fibrosis seen on test .

## 2022-05-12 LAB — CELIAC DISEASE AB SCREEN W/RFX
Antigliadin Abs, IgA: 3 units (ref 0–19)
Transglutaminase IgA: <2 U/mL (ref 0–3)

## 2022-05-12 LAB — HEPATITIS B CORE ANTIBODY, TOTAL: Hep B Core Total Ab: NEGATIVE

## 2022-05-12 LAB — IMMUNOGLOBULINS A/E/G/M, SERUM
IgA/Immunoglobulin A, Serum: 186 mg/dL (ref 87–352)
IgE (Immunoglobulin E), Serum: 45 IU/mL (ref 6–495)
IgG (Immunoglobin G), Serum: 1213 mg/dL (ref 586–1602)
IgM (Immunoglobulin M), Srm: 119 mg/dL (ref 26–217)

## 2022-05-12 LAB — ANTI-MICROSOMAL ANTIBODY LIVER / KIDNEY: LKM1 Ab: 0.9 Units (ref 0.0–20.0)

## 2022-05-12 LAB — IRON,TIBC AND FERRITIN PANEL
Ferritin: 32 ng/mL (ref 15–150)
Iron Saturation: 6 % — CL (ref 15–55)
Iron: 26 ug/dL — ABNORMAL LOW (ref 27–159)
Total Iron Binding Capacity: 434 ug/dL (ref 250–450)
UIBC: 408 ug/dL (ref 131–425)

## 2022-05-12 LAB — HEPATITIS B SURFACE ANTIBODY,QUALITATIVE: Hep B Surface Ab, Qual: REACTIVE

## 2022-05-12 LAB — HIV ANTIBODY (ROUTINE TESTING W REFLEX): HIV Screen 4th Generation wRfx: NONREACTIVE

## 2022-05-12 LAB — HEPATITIS B SURFACE ANTIGEN: Hepatitis B Surface Ag: NEGATIVE

## 2022-05-12 LAB — HEPATITIS A ANTIBODY, TOTAL: hep A Total Ab: POSITIVE — AB

## 2022-05-12 LAB — MITOCHONDRIAL/SMOOTH MUSCLE AB PNL
Mitochondrial Ab: <20 Units (ref 0.0–20.0)
Smooth Muscle Ab: 13 Units (ref 0–19)

## 2022-05-12 LAB — ALPHA-1-ANTITRYPSIN: A-1 Antitrypsin: 136 mg/dL (ref 101–187)

## 2022-05-12 LAB — ANA: Anti Nuclear Antibody (ANA): POSITIVE — AB

## 2022-05-12 LAB — CK: Total CK: 60 U/L (ref 32–182)

## 2022-05-12 LAB — CERULOPLASMIN: Ceruloplasmin: 27.8 mg/dL (ref 19.0–39.0)

## 2022-05-12 LAB — HEPATITIS B E ANTIGEN: Hep B E Ag: NEGATIVE

## 2022-05-12 LAB — HEPATITIS B E ANTIBODY: Hep B E Ab: NEGATIVE

## 2022-05-16 ENCOUNTER — Ambulatory Visit
Admission: RE | Admit: 2022-05-16 | Discharge: 2022-05-16 | Disposition: A | Discharge status: Home / Self Care | Payer: Medicaid Other | Source: Ambulatory Visit | Attending: Gastroenterology | Admitting: Gastroenterology

## 2022-05-16 ENCOUNTER — Telehealth: Payer: Self-pay

## 2022-05-16 DIAGNOSIS — R7989 Other specified abnormal findings of blood chemistry: Secondary | ICD-10-CM | POA: Insufficient documentation

## 2022-05-16 NOTE — Telephone Encounter (Signed)
Called patient but it only rand, therefore, I was not able to leave her a voicemail. I reached out to patient's PCP-Dr. Neomia Dear so she knew her lab results and to ask how we could reach out to the patient to let her know her lab results and if she could help the patient achieve her goals in lowering her triglycerides. I also let Dr. Neomia Dear know that I could also send patient a letter but wasn't sure if she would understand it since last time that she was here, we had an interpreter to help Dr. Vicente Males. Awaiting on Dr. Levada Dy recommendation.

## 2022-05-16 NOTE — Telephone Encounter (Signed)
-----   Message from Jonathon Bellows, MD sent at 05/10/2022  9:38 AM EDT ----- Herb Grays inform fibrosure shows NASH- advise lose weight , reduce fats in diet - Triglycerides elevated - no fibrosis seen on test .

## 2022-05-16 NOTE — Progress Notes (Signed)
Shows hepatic steatosis

## 2022-05-17 ENCOUNTER — Encounter: Payer: Self-pay | Admitting: Internal Medicine

## 2022-05-18 NOTE — Telephone Encounter (Signed)
Called patient's husband and left him a detailed message letting him know the below information.

## 2022-07-17 ENCOUNTER — Ambulatory Visit: Payer: Medicaid Other | Admitting: Unknown Physician Specialty

## 2022-07-18 ENCOUNTER — Ambulatory Visit: Payer: Medicaid Other | Admitting: Unknown Physician Specialty

## 2022-07-19 ENCOUNTER — Ambulatory Visit: Payer: Medicaid Other | Admitting: Internal Medicine

## 2022-07-24 ENCOUNTER — Ambulatory Visit: Payer: Medicaid Other | Admitting: Unknown Physician Specialty

## 2022-07-31 ENCOUNTER — Encounter: Payer: Self-pay | Admitting: Unknown Physician Specialty

## 2022-07-31 ENCOUNTER — Ambulatory Visit (INDEPENDENT_AMBULATORY_CARE_PROVIDER_SITE_OTHER): Payer: Medicaid Other | Admitting: Unknown Physician Specialty

## 2022-07-31 VITALS — BP 124/76 | HR 69 | Temp 97.7°F | Ht 62.21 in | Wt 194.0 lb

## 2022-07-31 DIAGNOSIS — E119 Type 2 diabetes mellitus without complications: Secondary | ICD-10-CM | POA: Diagnosis not present

## 2022-07-31 DIAGNOSIS — E785 Hyperlipidemia, unspecified: Secondary | ICD-10-CM | POA: Insufficient documentation

## 2022-07-31 LAB — MICROALBUMIN, URINE WAIVED
Creatinine, Urine Waived: 100 mg/dL (ref 10–300)
Microalb, Ur Waived: 10 mg/L (ref 0–19)
Microalb/Creat Ratio: <30 mg/g (ref <30)

## 2022-07-31 LAB — BAYER DCA HB A1C WAIVED: HB A1C (BAYER DCA - WAIVED): 6.3 % — ABNORMAL HIGH (ref 4.8–5.6)

## 2022-07-31 MED ORDER — METFORMIN HCL 500 MG PO TABS: 1000.0000 mg | ORAL_TABLET | Freq: Two times a day (BID) | ORAL | 3 refills | Status: AC

## 2022-07-31 NOTE — Assessment & Plan Note (Signed)
On Tricor.  Check LDL.  If above 70 add statin and stop Tricor

## 2022-07-31 NOTE — Assessment & Plan Note (Addendum)
Hgb A1C was 6.3%  Will restart Metformin and titrate up to 2,000 mg/day over a month.  Hold Jardiance for now.  Microalbumin was negative.

## 2022-07-31 NOTE — Progress Notes (Signed)
BP 124/76   Pulse 69   Temp 97.7 F (36.5 C) (Oral)   Ht 5' 2.21" (1.58 m)   Wt 194 lb (88 kg)   SpO2 99%   BMI 35.25 kg/m    Subjective:    Patient ID: Catherine Gray, female    DOB: 1981/08/19, 41 y.o.   MRN: [id number]  HPI: Catherine Gray is a 41 y.o. female  Chief Complaint  Patient presents with   Diabetes   Hyperlipidemia   Pt here with husband and also using a Pashto interpreter.  Here to f/u on DMStates she had surgery last week and stopped taking her medication.  States she took in for 3 months without any problems.  Does not check blood sugar at home.    Relevant past medical, surgical, family and social history reviewed and updated as indicated. Interim medical history since our last visit reviewed. Allergies and medications reviewed and updated.  Review of Systems  Constitutional: Negative.   Respiratory: Negative.    Skin: Negative.   Psychiatric/Behavioral: Negative.      Per HPI unless specifically indicated above     Objective:    BP 124/76   Pulse 69   Temp 97.7 F (36.5 C) (Oral)   Ht 5' 2.21" (1.58 m)   Wt 194 lb (88 kg)   SpO2 99%   BMI 35.25 kg/m   Wt Readings from Last 3 Encounters:  07/31/22 194 lb (88 kg)  05/08/22 202 lb 12.8 oz (92 kg)  03/19/22 208 lb 6.4 oz (94.5 kg)    Physical Exam Constitutional:      General: She is not in acute distress.    Appearance: Normal appearance. She is well-developed.  HENT:     Head: Normocephalic and atraumatic.  Eyes:     General: Lids are normal. No scleral icterus.       Right eye: No discharge.        Left eye: No discharge.     Conjunctiva/sclera: Conjunctivae normal.  Neck:     Vascular: No carotid bruit or JVD.  Cardiovascular:     Rate and Rhythm: Normal rate and regular rhythm.     Heart sounds: Normal heart sounds.  Pulmonary:     Effort: Pulmonary effort is normal. No respiratory distress.     Breath sounds: Normal breath sounds.  Abdominal:     Palpations: There is  no hepatomegaly or splenomegaly.  Musculoskeletal:        General: Normal range of motion.     Cervical back: Normal range of motion and neck supple.  Skin:    General: Skin is warm and dry.     Coloration: Skin is not pale.     Findings: No rash.  Neurological:     Mental Status: She is alert and oriented to person, place, and time.  Psychiatric:        Behavior: Behavior normal.        Thought Content: Thought content normal.        Judgment: Judgment normal.     Results for orders placed or performed in visit on 07/31/22  Bayer DCA Hb A1c Waived  Result Value Ref Range   HB A1C (BAYER DCA - WAIVED) 6.3 (H) 4.8 - 5.6 %  Microalbumin, Urine Waived  Result Value Ref Range   Microalb, Ur Waived 10 0 - 19 mg/L   Creatinine, Urine Waived 100 10 - 300 mg/dL   Microalb/Creat Ratio <30 <30 mg/g  Assessment & Plan:   Problem List Items Addressed This Visit       Unprioritized   Diabetes mellitus without complication ([place]) - Primary    Hgb A1C was 6.3%  Will restart Metformin and titrate up to 2,000 mg/day over a month.  Hold Jardiance for now.  Microalbumin was negative.        Relevant Medications   metFORMIN (GLUCOPHAGE) 500 MG tablet   Other Relevant Orders   Bayer DCA Hb A1c Waived (Completed)   Comprehensive metabolic panel   Lipid Panel w/o Chol/HDL Ratio   Microalbumin, Urine Waived (Completed)   Hyperlipidemia    On Tricor.  Check LDL.  If above 70 add statin and stop Tricor        Follow up plan: Return in about 3 months (around 10/31/2022).

## 2022-08-01 LAB — COMPREHENSIVE METABOLIC PANEL
ALT: 25 IU/L (ref 0–32)
AST: 17 IU/L (ref 0–40)
Albumin/Globulin Ratio: 1.6 (ref 1.2–2.2)
Albumin: 4.5 g/dL (ref 3.9–4.9)
Alkaline Phosphatase: 101 IU/L (ref 44–121)
BUN/Creatinine Ratio: 19 (ref 9–23)
BUN: 12 mg/dL (ref 6–24)
Bilirubin Total: <0.2 mg/dL (ref 0.0–1.2)
CO2: 23 mmol/L (ref 20–29)
Calcium: 9.9 mg/dL (ref 8.7–10.2)
Chloride: 103 mmol/L (ref 96–106)
Creatinine, Ser: 0.64 mg/dL (ref 0.57–1.00)
Globulin, Total: 2.9 g/dL (ref 1.5–4.5)
Glucose: 116 mg/dL — ABNORMAL HIGH (ref 70–99)
Potassium: 4.4 mmol/L (ref 3.5–5.2)
Sodium: 142 mmol/L (ref 134–144)
Total Protein: 7.4 g/dL (ref 6.0–8.5)
eGFR: 114 mL/min/{1.73_m2} (ref >59)

## 2022-08-01 LAB — LIPID PANEL W/O CHOL/HDL RATIO
Cholesterol, Total: 174 mg/dL (ref 100–199)
HDL: 28 mg/dL — ABNORMAL LOW
LDL Chol Calc (NIH): 75 mg/dL (ref 0–99)
Triglycerides: 446 mg/dL — ABNORMAL HIGH (ref 0–149)
VLDL Cholesterol Cal: 71 mg/dL — ABNORMAL HIGH (ref 5–40)

## 2022-08-03 ENCOUNTER — Ambulatory Visit: Payer: Self-pay

## 2022-08-03 NOTE — Telephone Encounter (Signed)
  Chief Complaint: medication assistance Symptoms: NA Frequency: today Pertinent Negatives: NA Disposition: [] ED /[] Urgent Care (no appt availability in office) / [] Appointment(In office/virtual)/ []  [facility]/ [x] Home Care/ [] Refused Recommended Disposition /[]  Mobile Bus/ []  Follow-up with PCP Additional Notes: spoke with [place], pt's son. Advised him that pt can take both of these medications together. Pt is to take metformin BID with meals and can take fenofibrate with dinner or before bedtime. He verbalized understanding. No further assistance needed.   Summary: discuss medication   Patient's son interpreting on behalf of patient to inquire which medications patient should be taken and the directions   Please fu w/ son      Reason for Disposition  Caller has medicine question only, adult not sick, AND triager answers question  Answer Assessment - Initial Assessment Questions 1. NAME of MEDICINE: "What medicine(s) are you calling about?"     Metformin and fenofibrate 2. QUESTION: "What is your question?" (e.g., double dose of medicine, side effect)     Wanting to know if can take medications together 3. PRESCRIBER: "Who prescribed the medicine?" Reason: if prescribed by specialist, call should be referred to that group.     Dr. Neomia Dear and Malachy Mood, NP  Protocols used: Medication Question Call-A-AH

## 2022-08-03 NOTE — Telephone Encounter (Signed)
Patient's son interpreting on behalf of patient to inquire which medications patient should be taken and the directions   Please fu w/ son   Left message to call back.

## 2022-08-14 ENCOUNTER — Ambulatory Visit (INDEPENDENT_AMBULATORY_CARE_PROVIDER_SITE_OTHER): Payer: Medicaid Other | Admitting: Gastroenterology

## 2022-08-14 ENCOUNTER — Encounter: Payer: Self-pay | Admitting: Gastroenterology

## 2022-08-14 VITALS — BP 115/72 | HR 98 | Temp 98.1°F | Ht 62.0 in | Wt 195.8 lb

## 2022-08-14 DIAGNOSIS — K7581 Nonalcoholic steatohepatitis (NASH): Secondary | ICD-10-CM

## 2022-08-14 DIAGNOSIS — R7989 Other specified abnormal findings of blood chemistry: Secondary | ICD-10-CM

## 2022-08-14 NOTE — Patient Instructions (Signed)
Please follow up in a year with your new gastroenterologist. Good luck!

## 2022-08-14 NOTE — Progress Notes (Signed)
Jonathon Bellows MD, MRCP(U.K) [address]  [place]  [place], [place] [address]  Main: [phone]  Fax: [phone]   Primary Care Physician: [facility]  Primary Gastroenterologist:  Dr. Jonathon Bellows   Chief Complaint  Patient presents with   Elevated LFTS    HPI: Catherine Gray is a 41 y.o. female   Summary of history :  Initially referred and seen on 05/08/2022 for abnormal LFTs.She says that she was told 1 year back when she was in [place] that her LFTs were elevated.  Was told it was due to fatty liver disease. Alcohol use none  Drug use none Over the counter herbal supplements none New medications none Abdominal pain none Tattoos none Military service none Prior blood transfusion none Incarceration none History of travel originally from [place] was in [place] now with [place] Family history of liver disease yes due to fatty liver disease Recent change in weight: Yes this is the heaviest she has weighed.  She also suffers from diabetes and hypertension .  Patient speaks only [place] and visit conducted via interpreter  Interval history 05/08/2022-08/14/2022  05/16/2022: Right upper quadrant ultrasound shows hepatic steatosis  05/08/2022: 0.9 steatosis grading indicating marked or severe steatosis, Nash score of 0.75 suggesting N2 fibrosis score of 0.08 indicating no fibrosis.  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  ANA positive but rest of autoimmune work-up negative celiac serology negative  Patient does not speak any English for visit conducted via interpreter  Current Outpatient Medications  Medication Sig Dispense Refill   fenofibrate (TRICOR) 145 MG tablet Take 1 tablet (145 mg total) by mouth daily. 30 tablet 5   metFORMIN (GLUCOPHAGE) 500 MG tablet Take 2 tablets (1,000 mg total) by mouth 2 (two) times daily with a meal. 60 tablet 3   No  current facility-administered medications for this visit.    Allergies as of 08/14/2022   (No Known Allergies)    ROS:  General: Negative for anorexia, weight loss, fever, chills, fatigue, weakness. ENT: Negative for hoarseness, difficulty swallowing , nasal congestion. CV: Negative for chest pain, angina, palpitations, dyspnea on exertion, peripheral edema.  Respiratory: Negative for dyspnea at rest, dyspnea on exertion, cough, sputum, wheezing.  GI: See history of present illness. GU:  Negative for dysuria, hematuria, urinary incontinence, urinary frequency, nocturnal urination.  Endo: Negative for unusual weight change.    Physical Examination:   BP 115/72   Pulse 98   Temp 98.1 F (36.7 C) (Oral)   Ht 5' 2"  (1.575 m)   Wt 195 lb 12.8 oz (88.8 kg)   BMI 35.81 kg/m   General: Well-nourished, well-developed in no acute distress.  Eyes: No icterus. Conjunctivae pink. Mouth: Oropharyngeal mucosa moist and pink , no lesions erythema or exudate.  He Neuro: Alert and oriented x 3.  Grossly intact. Skin: Warm and dry, no jaundice.   Psych: Alert and cooperative, normal mood and affect.   Imaging Studies: No results found.  Assessment and Plan:   Catherine Gray is a 41 y.o. y/o female to follow-up for abnormal LFTs work-up indicates she has NASH with no fibrosis on FibroSure sure testing.  Explained that the cornerstone of treatment is lifestyle changes with diet glycemic control screening for hypertensi  on hyperlipidemia and treating appropriately she is already on metformin.  There are new drugs in phase 2 and phase 3 trials for NASH and  probably will be out in the next 6 months to 1 year.  I will bring her back in 1 year to discuss about this and if available we will decide if she would be a candidate for the same in the meanwhile would suggest to lose weight exercise consider Mediterranean diet avoid red meats.  Diet rich in fruits vegetables oily fish.  Information  provided.    Dr Jonathon Bellows  MD,MRCP [facility]) Follow up in 1 year Catherine Gray is moving out of town suggested to establish care with a new gastroenterologist)

## 2022-10-24 ENCOUNTER — Other Ambulatory Visit (INDEPENDENT_AMBULATORY_CARE_PROVIDER_SITE_OTHER): Payer: Self-pay | Admitting: Family Medicine

## 2022-11-01 ENCOUNTER — Ambulatory Visit: Payer: Medicaid Other | Admitting: Physician Assistant

## 2022-11-01 NOTE — Progress Notes (Deleted)
          Established Patient Office Visit  Name: Catherine Gray   MRN: [id number]    DOB: Mar 06, 1981   Date:11/01/2022  Today's Provider: Talitha Givens, MHS, PA-C Introduced myself to the patient as a PA-C and provided education on APPs in clinical practice.         Subjective  Chief Complaint  No chief complaint on file.   HPI  Diabetes, Type 2 - Last A1c *** - Medications: *** - Compliance: *** - Checking BG at home: *** - Diet: *** - Exercise: *** - Eye exam: *** - Foot exam: *** - Microalbumin: *** - Statin: *** - PNA vaccine: *** - Denies symptoms of hypoglycemia, polyuria, polydipsia, numbness extremities, foot ulcers/trauma  HYPERLIPIDEMIA Hyperlipidemia status: {Blank single:19197::"excellent compliance","good compliance","fair compliance","poor compliance"} Satisfied with current treatment?  {Blank single:19197::"yes","no"} Side effects:  {Blank single:19197::"yes","no"} Medication compliance: {Blank single:19197::"excellent compliance","good compliance","fair compliance","poor compliance"} Past cholesterol meds: {Blank multiple:19196::"none","atorvastain (lipitor)","lovastatin (mevacor)","pravastatin (pravachol)","rosuvastatin (crestor)","simvastatin (zocor)","vytorin","fenofibrate (tricor)","gemfibrozil","ezetimide (zetia)","niaspan","lovaza"} Supplements: {Blank multiple:19196::"none","fish oil","niacin","red yeast rice"} Aspirin:  {Blank single:19197::"yes","no"} The 10-year ASCVD risk score (Arnett DK, et al., 2019) is: 2.6%   Values used to calculate the score:     Age: 41 years     Sex: Female     Is Non-Hispanic African American: No     Diabetic: Yes     Tobacco smoker: No     Systolic Blood Pressure: 277 mmHg     Is BP treated: No     HDL Cholesterol: 28 mg/dL     Total Cholesterol: 174 mg/dL Chest pain:  {Blank single:19197::"yes","no"} Coronary artery disease:  {Blank single:19197::"yes","no"} Family history CAD:  {Blank  single:19197::"yes","no"} Family history early CAD:  {Blank single:19197::"yes","no"}   Patient Active Problem List   Diagnosis Date Noted   Hyperlipidemia 07/31/2022   Vaginal bleeding 03/19/2022   Diabetes mellitus without complication ([place]) 82/42/3536   Elevated LFTs 03/19/2022    No past surgical history on file.  No family history on file.  Social History   Tobacco Use   Smoking status: Never   Smokeless tobacco: Never  Substance Use Topics   Alcohol use: Never     Current Outpatient Medications:    fenofibrate (TRICOR) 145 MG tablet, Take 1 tablet (145 mg total) by mouth daily., Disp: 30 tablet, Rfl: 5   metFORMIN (GLUCOPHAGE) 500 MG tablet, Take 2 tablets (1,000 mg total) by mouth 2 (two) times daily with a meal., Disp: 60 tablet, Rfl: 3  No Known Allergies  I personally reviewed {Reviewed:14835} with the patient/caregiver today.   ROS    Objective  There were no vitals filed for this visit.  There is no height or weight on file to calculate BMI.  Physical Exam   No results found for this or any previous visit (from the past 2160 hour(s)).   PHQ2/9:     No data to display            Fall Risk:    07/31/2022    2:00 PM 02/15/2022    1:43 PM  [place] in the past year? 0 0  Number falls in past yr: 0 0  Injury with Fall? 0 0  Risk for fall due to : No Fall Risks No Fall Risks  Follow up Falls evaluation completed Falls evaluation completed      Functional Status Survey:      Assessment & Plan

## 2022-11-22 ENCOUNTER — Other Ambulatory Visit (INDEPENDENT_AMBULATORY_CARE_PROVIDER_SITE_OTHER): Payer: Self-pay | Admitting: Family Medicine

## 2022-12-28 ENCOUNTER — Inpatient Hospital Stay: Payer: 59 | Attending: Medical | Admitting: Physical Therapist

## 2022-12-28 ENCOUNTER — Encounter: Payer: Self-pay | Admitting: Physical Therapist

## 2022-12-28 VITALS — BP 129/73 | HR 77

## 2022-12-28 DIAGNOSIS — M25572 Pain in left ankle and joints of left foot: Secondary | ICD-10-CM | POA: Insufficient documentation

## 2022-12-28 DIAGNOSIS — M79661 Pain in right lower leg: Secondary | ICD-10-CM | POA: Insufficient documentation

## 2022-12-28 DIAGNOSIS — M79662 Pain in left lower leg: Secondary | ICD-10-CM | POA: Insufficient documentation

## 2022-12-28 DIAGNOSIS — M25571 Pain in right ankle and joints of right foot: Secondary | ICD-10-CM

## 2022-12-28 NOTE — Progress Notes (Signed)
Name:Alexandra Clements Age: 42 y.o.   Date of Service: 12/28/2022  Referring Physician: Dulcy Clements*   Date of Injury: No data found 11/27/2022  PT Date Care Plan Established/Reviewed:12/28/2022  PT Date Treatment Started:12/28/2022  OT Date Care Plan Established/Reviewed: No data found  OT Date Treatment Started: No data found    (Historic) Date of Injury:No data was found  (Historic) Date Care Plan Established/Reviewed No data was found  No data was found  (Historic) Date Treatment Started No data was found No data was found    End of Certification Date: 123456  Sessions in Plan of Care: No data was found  Surgery Date: No data was found  MD Follow-up: No data was found  Medbridge Code: [email or web address]    Visit Count: 1   Diagnosis:    Diagnosis ICD-10-CM Associated Order   1. Pain in left ankle and joints of left foot  M25.572       2. Pain in right lower leg  M79.661       3. Pain in right ankle and joints of right foot  M25.571       4. Pain in left lower leg  M79.662           Subjective     History of Present Illness   History of Present Illness: Pt presents with B foot and heel pain. The pain on the L is localized to the heel, with occasional pain into the lower leg / up to the knee. The pain on the R is localized to the lower leg and knee, denies pain in the R heel at this time. Pt reports pain, numbness, and cramping in B feet (L>R). Denies pain in lumbar spine. The pain is aggravated with walking >10 minutes, standing >5 minutes, sitting >30 minutes. The pain is always there, but the activity makes it worse. The pain is aggravated when she wakes up in the morning; her heels feel very "hard" when she gets out of bed.   The pain started about 8 months ago. No specific inciting injury or accident.   Pt reports she tried a frozen water bottle without improvement - reports trying this for six weeks.   Pt reports no change when wearing shoes compared to barefoot.   Not currently taking any pain medication. Was  taking it a few weeks ago, however it did not help so she discontinued it.     Pt reporst history of diabetes which she takes medication for. No other regular medications.     Functional Limitations (PLOF):   Unable to walk >10 minutes without pain (previously able to walk 30 minutes without restrictions or pain)  Unable to stand >10 minutes to complete household chores (previously able to stand 30 minutes without restriction or pain)  Currently negotiating stairs with step to pattern, with pain (previously able to perform with step through pattern, without pain)  Unable to get out of bed in the morning without increased pain (previously no issues)      Daily Subjective   Treatment was provided with the assistance of interpreter to accommodate limited [place] proficiency. Interpreter Name/ ID#: [id number]).      Social Support/Occupation    Lives in: multiple level home         Occupation: not working outside of home           Precautions: diabetes  Allergies: Patient has no allergy information on record.    History reviewed. No pertinent past medical history.  Objective     Posture     Ankle/Foot   Ankle/Foot (Left): Pes planus.   Ankle/Foot (Right): Pes planus.     Comments  Standing with B feet turned out    Gait   Within Functional Limits    Integumentary   no wound, lesion or rash noted    Palpation IE 12/28/2022: increased resting tissue tension and TTP in B gastroc and soleus; TTP at L>R heel and plantar surface    Neurological Testing     Sensation     Ankle/Foot   Left Ankle/Foot   Intact: light touch    Right Ankle/Foot   Intact: light touch              FOTO deferred due to language    Ankle  ROM  Initial  R 12/28/2022  AROM Initial  L 12/28/2022   AROM   R A/PROM   L A/PROM   R A/PROM   L A/PROM   DF 5 5 p!       PF 40 30 p!       Inv 30 30 p!       Ever 10 12 p!       1st ray Flex         1st ray Ext         (blank fields were intentionally left blank)  IE: assessed in long sitting     Ankle Strength  PSI  Initial  R 12/28/2022  Initial   L 12/28/2022    R   L   R   L   R   L   R   L   DF 24.2 16.0           PF 28.5 26.1           Inv 13.0 13.1           Ev 14.3 14.4           Toe Ext             Quads  27.9 25.7           HS Seated              HS Prone              Hip Abd             (blank fields were intentionally left blank)   IE: assessed in long sitting     Stance Surface Eyes Initial  01/05    Single Leg Firm Ground Open R = 15 sec  L = 7 sec      Closed      Foam Open       Closed     (blank fields were intentionally left blank)      BP: 129/73 Heart Rate: 77    Treatment     Therapeutic Exercises - Justified to address any of the following:  To develop strength, endurance, ROM and/or flexibility.   Instructed pt in initial HEP sets/reps and technique (see below).   Limited due to auth required after eval.     Home Exercises   Access Code: [email or web address]  URL: [email or web address]  Date: 12/28/2022  Prepared by: Alexandra Clements    Exercises  - Seated Ankle Circles  - 1 x daily - 20 reps  - Gastroc Stretch on Wall  - 1 x daily - 3 reps - 30 sec hold       ---  Flowsheet Row ---   Total Time    Timed Minutes 5 minutes   Untimed Minutes 20 minutes   Total Time 25 minutes          Assessment   Alexandra Clements is a 42 y.o. female presenting with B ankle and lower leg pain who requires skilled Physical Therapy services.    Clinical presentation: stable - predictable recovery pattern    Barriers to therapy:   Time since onset of injury/illness/exacerbation - increased biomechanical compensation over time  Language barrier - requires use of translator as barrier to direct communication    Impairments:   Pain that limits and interferes with functional ability  Impaired postural alignment  Decreased range of motion  Decreased strength  Decreased functional stability  Decreased joint mobility  Decreased soft tissue mobility  Decreased static balance  Decreased dynamic balance    Functional Limitations (PLOF):   Unable to walk  >10 minutes without pain (previously able to walk 30 minutes without restrictions or pain)  Unable to stand >10 minutes to complete household chores (previously able to stand 30 minutes without restriction or pain)  Currently negotiating stairs with step to pattern, with pain (previously able to perform with step through pattern, without pain)  Unable to get out of bed in the morning without increased pain (previously no issues)    Prognosis: good  Patient is aware of diagnosis, prognosis and consents to plan of care: Yes  Plan   Visits per week: 2  Number of Sessions: 20  Direct One on One  97110: Therapeutic Exercise: To Develop Strength and Endurance, ROM and Flexibility  [id number]: Gait Training  [phone]: Neuromuscular Reeducation (Proprioceptive Neuromuscular Facilitation)  97140: Manual Therapy techniques (mobilization, manipulation, manual traction) (Grade I-V to talocrural joint, subtalar joint, tarsals, hip, pelvis, and regionally interdependent joints, soft tissue mobilization, instrument assisted soft tissue mobilization.)  97530: Therapeutic Activities: Dynamic activities to improve functional performance  Dry Needling  Supervised Modalities  97010: Thermal modalities: hot/cold packs  97014: Electrical stimulation    Use translator (Dari), may prefer private room for FU sessions - confirm with pt    Plan for next session: ankle ROM and strength, balance, update HEP, review icing      Goals      Goal 1: Patient will demonstrate independence in initial HEP with proper form, sets and reps for safe completion at home in conjunction with PT POC.   Sessions: 10      Goal 2: Pt will improve B ankle DF AROM to improve gait mechanics and ability to walk 30 minutes without increased pain.    Sessions: 10      Goal 3: Patient will demonstrate independence in prescribed HEP with proper form, sets and reps for safe discharge to an independent program.   Sessions: 20      Goal 4: Pt will improve B DF strength to 40 lbs or  greater in order to negotiate stairs with step through pattern and without pain.    Sessions: 20          Goal 5: Pt will improve single leg stance to 20 sec or greater in order to improve balance and safety when negotiating curbs / uneven surfaces in community.    Sessions: [place], DPT

## 2023-01-01 ENCOUNTER — Inpatient Hospital Stay: Payer: 59 | Admitting: Physical Therapist

## 2023-01-09 ENCOUNTER — Inpatient Hospital Stay: Payer: 59

## 2023-01-11 ENCOUNTER — Inpatient Hospital Stay: Payer: 59 | Admitting: Physical Therapist

## 2023-01-14 ENCOUNTER — Inpatient Hospital Stay: Payer: 59 | Attending: Medical | Admitting: Physical Therapist

## 2023-01-14 DIAGNOSIS — M25572 Pain in left ankle and joints of left foot: Secondary | ICD-10-CM

## 2023-01-14 DIAGNOSIS — M79662 Pain in left lower leg: Secondary | ICD-10-CM

## 2023-01-14 DIAGNOSIS — M25571 Pain in right ankle and joints of right foot: Secondary | ICD-10-CM | POA: Insufficient documentation

## 2023-01-14 DIAGNOSIS — M79661 Pain in right lower leg: Secondary | ICD-10-CM

## 2023-01-14 NOTE — PT/OT Therapy Note (Signed)
Name: Alexandra Clements Age: 42 y.o.   Date of Service: 01/14/2023  Referring Physician: Dulcy Fanny*   Date of Injury: No data found 11/27/2022  PT Date Care Plan Established/Reviewed:12/28/2022  PT Date Treatment Started:12/28/2022  OT Date Care Plan Established/Reviewed: No data found  OT Date Treatment Started: No data found    (Historic) Date of Injury:No data was found  (Historic) Date Care Plan Established/Reviewed No data was found  No data was found  (Historic) Date Treatment Started No data was found No data was found    End of Certification Date: 123456  Sessions in Plan of Care: No data was found  Surgery Date: No data was found  MD Follow-up: No data was found  Medbridge Code: [email or web address]    Visit Count: 2   Diagnosis:    Diagnosis ICD-10-CM Associated Order   1. Pain in left ankle and joints of left foot  M25.572       2. Pain in right ankle and joints of right foot  M25.571       3. Pain in right lower leg  M79.661       4. Pain in left lower leg  M79.662                Subjective     History of Present Illness   History of Present Illness: @ IE : t presents with B foot and heel pain. The pain on the L is localized to the heel, with occasional pain into the lower leg / up to the knee. The pain on the R is localized to the lower leg and knee, denies pain in the R heel at this time. Pt reports pain, numbness, and cramping in B feet (L>R). Denies pain in lumbar spine. The pain is aggravated with walking >10 minutes, standing >5 minutes, sitting >30 minutes. The pain is always there, but the activity makes it worse. The pain is aggravated when she wakes up in the morning; her heels feel very "hard" when she gets out of bed.   The pain started about 8 months ago. No specific inciting injury or accident.   Pt reports she tried a frozen water bottle without improvement - reports trying this for six weeks.   Pt reports no change when wearing shoes compared to barefoot.   Not currently taking any pain  medication. Was taking it a few weeks ago, however it did not help so she discontinued it.      Pt reporst history of diabetes which she takes medication for. No other regular medications.        Functional Limitations (PLOF): @ IE : Unable to walk >10 minutes without pain (previously able to walk 30 minutes without restrictions or pain)  Unable to stand >10 minutes to complete household chores (previously able to stand 30 minutes without restriction or pain)  Currently negotiating stairs with step to pattern, with pain (previously able to perform with step through pattern, without pain)  Unable to get out of bed in the morning without increased pain (previously no issues)    Daily Subjective   Treatment was provided with the assistance of interpreter to accommodate limited [place] proficiency. Interpreter Name/ ID#: [id number]).    C/o L heel pain and stiffness in L outer lower leg.     Social Support/Occupation    Lives in: multiple level home         Occupation: not working outside of home  Precautions: diabetes  Allergies: Patient has no allergy information on record.    Objective                Objective      Posture      Ankle/Foot   Ankle/Foot (Left): Pes planus.   Ankle/Foot (Right): Pes planus.      Comments  Standing with B feet turned out     Gait   Within Functional Limits     Integumentary   no wound, lesion or rash noted     Palpation IE 12/28/2022: increased resting tissue tension and TTP in B gastroc and soleus; TTP at L>R heel and plantar surface     Neurological Testing      Sensation      Ankle/Foot   Left Ankle/Foot   Intact: light touch     Right Ankle/Foot   Intact: light touch               FOTO deferred due to language     Ankle  ROM  Initial  R 12/28/2022  AROM Initial  L 12/28/2022   AROM    R A/PROM    L A/PROM    R A/PROM    L A/PROM   DF 5 5 p!           PF 40 30 p!           Inv 30 30 p!           Ever 10 12 p!           1st ray Flex               1st ray Ext               (blank  fields were intentionally left blank)  IE: assessed in long sitting      Ankle Strength  PSI Initial  R 12/28/2022  Initial   L 12/28/2022     R    L    R    L    R    L    R    L   DF 24.2 16.0                   PF 28.5 26.1                   Inv 13.0 13.1                   Ev 14.3 14.4                   Toe Ext                       Quads  27.9 25.7                   HS Seated                        HS Prone                        Hip Abd                       (blank fields were intentionally left blank)   IE: assessed in long sitting      Stance Surface Eyes Initial  01/05     Single Leg Firm Ground Open R = 15 sec  L = 7 sec       Closed        Foam Open         Closed       (blank fields were intentionally left blank)        BP: 129/73 Heart Rate: 77           Home Exercises                 Treatment     Therapeutic Exercises - Justified to address any of the following:  To develop strength, endurance, ROM and/or flexibility.   Subjective intake    Seated marble pick up with L toes x 1    Ankle resisted DF, Inversion L LE RTB 20 reps each exercise     HS stretch with strap 30 seconds hold x 2 reps    Calf stretch on incline board 1 x 1 minute hold    Updated HEP    Neuromuscular Re-Education - Justified to address any of the following:   Re-education of movement, balance, coordination, kinesthetic sense, posture and/or proprioception for sitting and/or standing activities.   Rockerboard x 2 min each direction w/ vc & tactile cues to facilitate TrA, VMO/quad & glut recruitment & foot/ankle muscle co-contractions to decrease LOB during gait.    Toe Yoga x 10 reps     Manual Therapy - Justified to address any of the following:    Mobilization of joints and soft tissues, manipulation, manual lymphatic drainage, and/or manual traction.    Supine : STM to L Lateral lower leg muscles, L heel     Home Exercises   Access Code: [email or web address]         ---      Flowsheet Row ---   Total Time    Timed Minutes 41 minutes   Total Time  41 minutes          Assessment   Pt challenged with Toe Yoga.   Time taken to interpret during PT session.   Patient responds well to prescribed exercises and entire PT session today. Patient requires skilled supervised out-patient physical therapy session to restore prior level of function.    Functional Limitations (PLOF): @ IE : Unable to walk >10 minutes without pain (previously able to walk 30 minutes without restrictions or pain)  Unable to stand >10 minutes to complete household chores (previously able to stand 30 minutes without restriction or pain)  Currently negotiating stairs with step to pattern, with pain (previously able to perform with step through pattern, without pain)  Unable to get out of bed in the morning without increased pain (previously no issues)  Plan   ankle ROM and strength, balance, update HEP, review icing    Use translator Caprice Beaver)  Prefers private treatment room. Okay with using common area for specific exercises      Goals      Goal 1: Patient will demonstrate independence in initial HEP with proper form, sets and reps for safe completion at home in conjunction with PT POC.   Sessions: 10      Goal 2: Pt will improve B ankle DF AROM to improve gait mechanics and ability to walk 30 minutes without increased pain.    Sessions: 10      Goal 3: Patient will demonstrate independence in prescribed HEP with proper form, sets and reps for safe discharge  to an independent program.   Sessions: 20      Goal 4: Pt will improve B DF strength to 40 lbs or greater in order to negotiate stairs with step through pattern and without pain.    Sessions: 20          Goal 5: Pt will improve single leg stance to 20 sec or greater in order to improve balance and safety when negotiating curbs / uneven surfaces in community.    Sessions: [place], DPT

## 2023-01-16 ENCOUNTER — Inpatient Hospital Stay: Payer: 59 | Attending: Medical | Admitting: Physical Therapist

## 2023-01-16 DIAGNOSIS — M79661 Pain in right lower leg: Secondary | ICD-10-CM

## 2023-01-16 DIAGNOSIS — M79662 Pain in left lower leg: Secondary | ICD-10-CM | POA: Insufficient documentation

## 2023-01-16 DIAGNOSIS — M25572 Pain in left ankle and joints of left foot: Secondary | ICD-10-CM | POA: Insufficient documentation

## 2023-01-16 DIAGNOSIS — M25571 Pain in right ankle and joints of right foot: Secondary | ICD-10-CM | POA: Insufficient documentation

## 2023-01-16 NOTE — PT/OT Therapy Note (Signed)
Name: Nyra Dusseault Age: 42 y.o.   Date of Service: 01/16/2023  Referring Physician: Dulcy Fanny*   Date of Injury: No data found 11/27/2022  PT Date Care Plan Established/Reviewed:12/28/2022  PT Date Treatment Started:12/28/2022  OT Date Care Plan Established/Reviewed: No data found  OT Date Treatment Started: No data found    (Historic) Date of Injury:No data was found  (Historic) Date Care Plan Established/Reviewed No data was found  No data was found  (Historic) Date Treatment Started No data was found No data was found    End of Certification Date: 123456  Sessions in Plan of Care: No data was found  Surgery Date: No data was found  MD Follow-up: No data was found  Medbridge Code: [email or web address]    Visit Count: 3   Diagnosis:    Diagnosis ICD-10-CM Associated Order   1. Pain in left ankle and joints of left foot  M25.572       2. Pain in right ankle and joints of right foot  M25.571       3. Pain in right lower leg  M79.661       4. Pain in left lower leg  M79.662                Subjective     History of Present Illness   History of Present Illness: @ IE : Pt presents with B foot and heel pain. The pain on the L is localized to the heel, with occasional pain into the lower leg / up to the knee. The pain on the R is localized to the lower leg and knee, denies pain in the R heel at this time. Pt reports pain, numbness, and cramping in B feet (L>R). Denies pain in lumbar spine. The pain is aggravated with walking >10 minutes, standing >5 minutes, sitting >30 minutes. The pain is always there, but the activity makes it worse. The pain is aggravated when she wakes up in the morning; her heels feel very "hard" when she gets out of bed.   The pain started about 8 months ago. No specific inciting injury or accident.   Pt reports she tried a frozen water bottle without improvement - reports trying this for six weeks.   Pt reports no change when wearing shoes compared to barefoot.   Not currently taking any pain  medication. Was taking it a few weeks ago, however it did not help so she discontinued it.      Pt reporst history of diabetes which she takes medication for. No other regular medications.        Functional Limitations (PLOF): @ IE : Unable to walk >10 minutes without pain (previously able to walk 30 minutes without restrictions or pain)  Unable to stand >10 minutes to complete household chores (previously able to stand 30 minutes without restriction or pain)  Currently negotiating stairs with step to pattern, with pain (previously able to perform with step through pattern, without pain)  Unable to get out of bed in the morning without increased pain (previously no issues)    Daily Subjective   Treatment was provided with the assistance of interpreter to accommodate limited [place] proficiency. Interpreter Name/ ID#: [id number]).    Pt was comfortable following the previous PT session,   Reports after the session, when she went back home was a little sore but after a few hours until today, she is feeling better  VAS     Social Support/Occupation  Lives in: multiple level home         Occupation: not working outside of home           Precautions: diabetes  Allergies: Patient has no allergy information on record.    Objective                Objective      Posture      Ankle/Foot   Ankle/Foot (Left): Pes planus.   Ankle/Foot (Right): Pes planus.      Comments  Standing with B feet turned out     Gait   Within Functional Limits     Integumentary   no wound, lesion or rash noted     Palpation IE 12/28/2022: increased resting tissue tension and TTP in B gastroc and soleus; TTP at L>R heel and plantar surface     Neurological Testing      Sensation      Ankle/Foot   Left Ankle/Foot   Intact: light touch     Right Ankle/Foot   Intact: light touch               FOTO deferred due to language     Ankle  ROM  Initial  R 12/28/2022  AROM Initial  L 12/28/2022   AROM    R A/PROM    L A/PROM    R A/PROM    L A/PROM   DF 5 5 p!            PF 40 30 p!           Inv 30 30 p!           Ever 10 12 p!           1st ray Flex               1st ray Ext               (blank fields were intentionally left blank)  IE: assessed in long sitting      Ankle Strength  PSI Initial  R 12/28/2022  Initial   L 12/28/2022     R    L    R    L    R    L    R    L   DF 24.2 16.0                   PF 28.5 26.1                   Inv 13.0 13.1                   Ev 14.3 14.4                   Toe Ext                       Quads  27.9 25.7                   HS Seated                        HS Prone                        Hip Abd                       (  blank fields were intentionally left blank)   IE: assessed in long sitting      Stance Surface Eyes Initial  01/05     Single Leg Firm Ground Open R = 15 sec  L = 7 sec       Closed        Foam Open         Closed       (blank fields were intentionally left blank)        BP: 129/73 Heart Rate: 77           Home Exercises                 Treatment     Therapeutic Exercises - Justified to address any of the following:  To develop strength, endurance, ROM and/or flexibility.   Subjective intake    Ankle resisted DF, Inversion L LE RTB 3 sets of 10 reps each exercise     Calf stretch on incline board 1 x 1 minute hold with knee straight    Added: Calf stretch on incline board with knee bent 1 x 1 minute hold    B Leg Press on 65# x 20 reps with cueing on technique    Updated HEP    Neuromuscular Re-Education - Justified to address any of the following:   Re-education of movement, balance, coordination, kinesthetic sense, posture and/or proprioception for sitting and/or standing activities.   Rockerboard x 2 min each direction w/ vc & tactile cues to facilitate TrA, VMO/quad & glut recruitment & foot/ankle muscle co-contractions to decrease LOB during gait.    Tandem  stance on Airex foam pad 2 reps of 30 seconds hold each side    Manual Therapy - Justified to address any of the following:    Mobilization of joints and soft tissues,  manipulation, manual lymphatic drainage, and/or manual traction.    Supine : STM to L Lateral lower leg muscles    Home Exercises   Access Code: [email or web address]  URL: [email or web address]  Date: 01/16/2023  Prepared by: Lain Tetterton    Exercises  - Seated Ankle Circles  - 1 x daily - 20 reps  - Gastroc Stretch on Wall  - 1 x daily - 3 reps - 30 sec hold  - Toe Yoga - Alternating Great Toe and Lesser Toe Extension  - 1 x daily - 7 x weekly - 2 sets - 10 reps  - Hooklying Hamstring Stretch with Strap  - 1 x daily - 7 x weekly - 1 sets - 2 reps - 30 sec hold  - Seated Ankle Inversion with Resistance and Legs Crossed  - 1 x daily - 7 x weekly - 3 sets - 10 reps  - Ankle Eversion with Resistance  - 1 x daily - 7 x weekly - 3 sets - 10 reps         ---      Flowsheet Row ---   Total Time    Timed Minutes 40 minutes   Total Time 40 minutes          Assessment   Patient demonstrates improving stability on wobble board. Pt demonstrates fair stability on Tandem stance.   Patient responds well to prescribed exercises and entire PT session today. Patient requires skilled supervised out-patient physical therapy session to restore prior level of function.    Functional Limitations (PLOF): @ IE : Unable to walk >10 minutes without pain (previously able to walk 30 minutes  without restrictions or pain)  Unable to stand >10 minutes to complete household chores (previously able to stand 30 minutes without restriction or pain)  Currently negotiating stairs with step to pattern, with pain (previously able to perform with step through pattern, without pain)  Unable to get out of bed in the morning without increased pain (previously no issues)  Plan   Continue [place] bike    Use translator Caprice Beaver)  Prefers private treatment room. Okay with using common area for specific exercises      Goals      Goal 1: Patient will demonstrate independence in initial HEP with proper form, sets and reps for safe completion at home in  conjunction with PT POC.   Sessions: 10      Goal 2: Pt will improve B ankle DF AROM to improve gait mechanics and ability to walk 30 minutes without increased pain.    Sessions: 10      Goal 3: Patient will demonstrate independence in prescribed HEP with proper form, sets and reps for safe discharge to an independent program.   Sessions: 20      Goal 4: Pt will improve B DF strength to 40 lbs or greater in order to negotiate stairs with step through pattern and without pain.    Sessions: 20          Goal 5: Pt will improve single leg stance to 20 sec or greater in order to improve balance and safety when negotiating curbs / uneven surfaces in community.    Sessions: [place], DPT

## 2023-01-21 ENCOUNTER — Inpatient Hospital Stay: Payer: 59 | Attending: Medical | Admitting: Physical Therapist

## 2023-01-21 DIAGNOSIS — M79662 Pain in left lower leg: Secondary | ICD-10-CM | POA: Insufficient documentation

## 2023-01-21 DIAGNOSIS — M25572 Pain in left ankle and joints of left foot: Secondary | ICD-10-CM | POA: Insufficient documentation

## 2023-01-21 DIAGNOSIS — M79661 Pain in right lower leg: Secondary | ICD-10-CM | POA: Insufficient documentation

## 2023-01-21 DIAGNOSIS — M25571 Pain in right ankle and joints of right foot: Secondary | ICD-10-CM | POA: Insufficient documentation

## 2023-01-21 NOTE — PT/OT Therapy Note (Signed)
Name: Alexandra Clements Age: 42 y.o.   Date of Service: 01/21/2023  Referring Physician: Dulcy Fanny*   Date of Injury: No data found 11/27/2022  PT Date Care Plan Established/Reviewed:12/28/2022  PT Date Treatment Started:12/28/2022  OT Date Care Plan Established/Reviewed: No data found  OT Date Treatment Started: No data found    (Historic) Date of Injury:No data was found  (Historic) Date Care Plan Established/Reviewed No data was found  No data was found  (Historic) Date Treatment Started No data was found No data was found    End of Certification Date: 123456  Sessions in Plan of Care: No data was found  Surgery Date: No data was found  MD Follow-up: No data was found  Medbridge Code: [email or web address]    Visit Count: 4   Diagnosis:    Diagnosis ICD-10-CM Associated Order   1. Pain in left ankle and joints of left foot  M25.572       2. Pain in right ankle and joints of right foot  M25.571       3. Pain in right lower leg  M79.661       4. Pain in left lower leg  M79.662              Subjective     History of Present Illness   History of Present Illness: @ IE : Pt presents with B foot and heel pain. The pain on the L is localized to the heel, with occasional pain into the lower leg / up to the knee. The pain on the R is localized to the lower leg and knee, denies pain in the R heel at this time. Pt reports pain, numbness, and cramping in B feet (L>R). Denies pain in lumbar spine. The pain is aggravated with walking >10 minutes, standing >5 minutes, sitting >30 minutes. The pain is always there, but the activity makes it worse. The pain is aggravated when she wakes up in the morning; her heels feel very "hard" when she gets out of bed.   The pain started about 8 months ago. No specific inciting injury or accident.   Pt reports she tried a frozen water bottle without improvement - reports trying this for six weeks.   Pt reports no change when wearing shoes compared to barefoot.   Not currently taking any pain  medication. Was taking it a few weeks ago, however it did not help so she discontinued it.      Pt reporst history of diabetes which she takes medication for. No other regular medications.        Functional Limitations (PLOF): @ IE : Unable to walk >10 minutes without pain (previously able to walk 30 minutes without restrictions or pain)  Unable to stand >10 minutes to complete household chores (previously able to stand 30 minutes without restriction or pain)  Currently negotiating stairs with step to pattern, with pain (previously able to perform with step through pattern, without pain)  Unable to get out of bed in the morning without increased pain (previously no issues)    Daily Subjective   Treatment was provided with the assistance of interpreter to accommodate limited [place] proficiency. Interpreter Name/ ID#: [id number]).    Pt was comfortable following the previous PT session,   Reports feeling better since start of physical therapy.   Able to walk for 1 hour w/o pain. Last week, she was jogging for a short while, felt no pain.   Compliant with HEP  States  she still feels tightness in the lower leg.     Social Support/Occupation    Lives in: multiple level home         Occupation: not working outside of home           Precautions: diabetes  Allergies: Patient has no allergy information on record.    Objective                Posture      Ankle/Foot   Ankle/Foot (Left): Pes planus.   Ankle/Foot (Right): Pes planus.      Comments  Standing with B feet turned out     Gait   Within Functional Limits     Integumentary   no wound, lesion or rash noted     Palpation IE 12/28/2022: increased resting tissue tension and TTP in B gastroc and soleus; TTP at L>R heel and plantar surface     Neurological Testing      Sensation      Ankle/Foot   Left Ankle/Foot   Intact: light touch     Right Ankle/Foot   Intact: light touch         FOTO deferred due to language     Ankle  ROM  Initial  R 12/28/2022  AROM Initial  L 12/28/2022    AROM  01/21/2023  L A/PROM    L A/PROM    R A/PROM    L A/PROM   DF 5 5 p! 7 degrees, mild pain          PF 40 30 p!           Inv 30 30 p!           Ever 10 12 p!           1st ray Flex               1st ray Ext               (blank fields were intentionally left blank)  IE: assessed in long sitting      Ankle Strength  PSI Initial  R 12/28/2022  Initial   L 12/28/2022     R    L    R    L    R    L    R    L   DF 24.2 16.0                   PF 28.5 26.1                   Inv 13.0 13.1                   Ev 14.3 14.4                   Toe Ext                       Quads  27.9 25.7                   HS Seated                        HS Prone                        Hip Abd                       (  blank fields were intentionally left blank)   IE: assessed in long sitting      Stance Surface Eyes Initial  01/05  1/29   Single Leg Firm Ground Open R = 15 sec  L = 7 sec L= 30 seconds  R= 35 seconds        Closed        Foam Open         Closed       (blank fields were intentionally left blank)        BP: 129/73 Heart Rate: 77             Treatment     Therapeutic Exercises - Justified to address any of the following:  To develop strength, endurance, ROM and/or flexibility.   recumbent bike L3 x 6 min w/subjective discussion and to prepare for session.     Measurement of Ankle ROM    Ankle resisted DF, Inversion L LE RTB 3 sets of 10 reps each exercise     Supine HS stretch with strap 3 reps of 30 seconds hold    Calf stretch on incline board 1 x 1 minute hold with knee straight    Added: Calf stretch on incline board with knee bent 1 x 1 minute hold    B Leg Press on 90# 3 sets of 10 reps with cueing on technique    Updated HEP    Neuromuscular Re-Education - Justified to address any of the following:   Re-education of movement, balance, coordination, kinesthetic sense, posture and/or proprioception for sitting and/or standing activities.   Rockerboard x 2 min each direction w/ vc & tactile cues to facilitate TrA, VMO/quad &  glut recruitment & foot/ankle muscle co-contractions to decrease LOB during gait.    Measurement of SLS    Tandem  stance on Airex foam pad 2 reps of 30 seconds hold each side    Manual Therapy - Justified to address any of the following:    Mobilization of joints and soft tissues, manipulation, manual lymphatic drainage, and/or manual traction.    Supine : STM to L Lateral lower leg muscles    Home Exercises   Access Code: [email or web address]  URL: [email or web address]  Date: 01/21/2023  Prepared by: Damaris Abeln Dance movement psychotherapist on [employer]  - 1 x daily - 3 reps - 30 sec hold  - Toe Yoga - Alternating Great Toe and Lesser Toe Extension  - 1 x daily - 7 x weekly - 2 sets - 10 reps  - Hooklying Hamstring Stretch with Strap  - 1 x daily - 7 x weekly - 1 sets - 2 reps - 30 sec hold  - Seated Ankle Inversion with Resistance and Legs Crossed  - 1 x daily - 7 x weekly - 3 sets - 10 reps  - Ankle Eversion with Resistance  - 1 x daily - 7 x weekly - 3 sets - 10 reps  - Long Sitting Ankle Dorsiflexion with Anchored Resistance  - 1 x daily - 7 x weekly - 3 sets - 10 reps  - Soleus Stretch on Wall  - 1 x daily - 7 x weekly - 1 sets - 2 reps - 30 seconds hold  - Supine ITB Stretch with Strap  - 1 x daily - 7 x weekly - 1 sets - 1 reps - 30 seconds hold       ---      Flowsheet Row ---  Total Time    Timed Minutes 39 minutes   Total Time 39 minutes            Assessment   Time taken for interpretation.  Pt progressing well, on a positive slope.   Improved SLS, improving ankle ROM noted.  Updated HEP  Patient responds well to prescribed exercises and entire PT session today. Patient requires skilled supervised out-patient physical therapy session to restore prior level of function.      Functional Limitations (PLOF): @ IE : Unable to walk >10 minutes without pain (previously able to walk 30 minutes without restrictions or pain)  Unable to stand >10 minutes to complete household chores (previously able to stand 30  minutes without restriction or pain)  Currently negotiating stairs with step to pattern, with pain (previously able to perform with step through pattern, without pain)  Unable to get out of bed in the morning without increased pain (previously no issues)  Plan   Continue POC    POC reduced to 1x/week ,should pt continue to improve. If pt reports increased symptoms, POC to increase to 2x/week     Use translator Caprice Beaver)  Prefers private treatment room. Okay with using common area for specific exercises      Goals      Goal 1: Patient will demonstrate independence in initial HEP with proper form, sets and reps for safe completion at home in conjunction with PT POC.   Sessions: 10      Goal 2: Pt will improve B ankle DF AROM to improve gait mechanics and ability to walk 30 minutes without increased pain.     1/29: Goal met,able to walk 60 minutes now w/o increased pain SM   Sessions: 10      Goal 3: Patient will demonstrate independence in prescribed HEP with proper form, sets and reps for safe discharge to an independent program.   Sessions: 20      Goal 4: Pt will improve B DF strength to 40 lbs or greater in order to negotiate stairs with step through pattern and without pain.    Sessions: 20          Goal 5: Pt will improve single leg stance to 20 sec or greater in order to improve balance and safety when negotiating curbs / uneven surfaces in community.     01/21/2023: Goal Met SM   Sessions: [place], DPT

## 2023-01-23 ENCOUNTER — Inpatient Hospital Stay: Payer: 59 | Admitting: Physical Therapist

## 2023-01-29 ENCOUNTER — Inpatient Hospital Stay: Payer: 59 | Admitting: Physical Therapist

## 2023-02-05 ENCOUNTER — Inpatient Hospital Stay: Payer: 59 | Attending: Medical | Admitting: Physical Therapist

## 2023-02-05 DIAGNOSIS — M25571 Pain in right ankle and joints of right foot: Secondary | ICD-10-CM

## 2023-02-05 DIAGNOSIS — M79661 Pain in right lower leg: Secondary | ICD-10-CM

## 2023-02-05 DIAGNOSIS — M79662 Pain in left lower leg: Secondary | ICD-10-CM

## 2023-02-05 DIAGNOSIS — M25572 Pain in left ankle and joints of left foot: Secondary | ICD-10-CM | POA: Insufficient documentation

## 2023-02-05 NOTE — PT/OT Therapy Note (Unsigned)
Name: Alexandra Clements Age: 42 y.o.   Date of Service: 02/05/2023  Referring Physician: Dulcy Fanny*   Date of Injury: No data found 11/27/2022  PT Date Care Plan Established/Reviewed:12/28/2022  PT Date Treatment Started:12/28/2022  OT Date Care Plan Established/Reviewed: No data found  OT Date Treatment Started: No data found    (Historic) Date of Injury:No data was found  (Historic) Date Care Plan Established/Reviewed No data was found  No data was found  (Historic) Date Treatment Started No data was found No data was found    End of Certification Date: 123456  Sessions in Plan of Care: No data was found  Surgery Date: No data was found  MD Follow-up: No data was found  Medbridge Code: [email or web address]    Visit Count: 5   Diagnosis:    Diagnosis ICD-10-CM Associated Order   1. Pain in left ankle and joints of left foot  M25.572       2. Pain in right ankle and joints of right foot  M25.571       3. Pain in right lower leg  M79.661       4. Pain in left lower leg  M79.662              Subjective     History of Present Illness   History of Present Illness: @ IE : Pt presents with B foot and heel pain. The pain on the L is localized to the heel, with occasional pain into the lower leg / up to the knee. The pain on the R is localized to the lower leg and knee, denies pain in the R heel at this time. Pt reports pain, numbness, and cramping in B feet (L>R). Denies pain in lumbar spine. The pain is aggravated with walking >10 minutes, standing >5 minutes, sitting >30 minutes. The pain is always there, but the activity makes it worse. The pain is aggravated when she wakes up in the morning; her heels feel very "hard" when she gets out of bed.   The pain started about 8 months ago. No specific inciting injury or accident.   Pt reports she tried a frozen water bottle without improvement - reports trying this for six weeks.   Pt reports no change when wearing shoes compared to barefoot.   Not currently taking any pain  medication. Was taking it a few weeks ago, however it did not help so she discontinued it.      Pt reporst history of diabetes which she takes medication for. No other regular medications.        Functional Limitations (PLOF): @ IE : Unable to walk >10 minutes without pain (previously able to walk 30 minutes without restrictions or pain)  Unable to stand >10 minutes to complete household chores (previously able to stand 30 minutes without restriction or pain)  Currently negotiating stairs with step to pattern, with pain (previously able to perform with step through pattern, without pain)  Unable to get out of bed in the morning without increased pain (previously no issues)    Daily Subjective   Treatment was provided with the assistance of interpreter to accommodate limited [employer] proficiency. Interpreter Name/ ID#: [id number]).    Pt was comfortable following the previous PT session,   Reports a few days ago, pt was jogging, notices tightness in the lower leg 10-15 minutes    Social Support/Occupation    Lives in: multiple level home  Occupation: not working outside of home           Precautions: diabetes  Allergies: Patient has no allergy information on record.    Objective                Posture      Ankle/Foot   Ankle/Foot (Left): Pes planus.   Ankle/Foot (Right): Pes planus.      Comments  Standing with B feet turned out     Gait   Within Functional Limits     Integumentary   no wound, lesion or rash noted     Palpation IE 12/28/2022: increased resting tissue tension and TTP in B gastroc and soleus; TTP at L>R heel and plantar surface     Neurological Testing      Sensation      Ankle/Foot   Left Ankle/Foot   Intact: light touch     Right Ankle/Foot   Intact: light touch         FOTO deferred due to language     Ankle  ROM  Initial  R 12/28/2022  AROM Initial  L 12/28/2022   AROM  01/21/2023  L A/PROM    L A/PROM    R A/PROM    L A/PROM   DF 5 5 p! 7 degrees, mild pain          PF 40 30 p!           Inv 30 30 p!            Ever 10 12 p!           1st ray Flex               1st ray Ext               (blank fields were intentionally left blank)  IE: assessed in long sitting      Ankle Strength  PSI Initial  R 12/28/2022  Initial   L 12/28/2022     R    L  02/05/2022    R    L    R    L    R    L   DF 24.2 16.0   19.2                PF 28.5 26.1                   Inv 13.0 13.1                   Ev 14.3 14.4   16.3                Toe Ext                       Quads  27.9 25.7                   HS Seated                        HS Prone                        Hip Abd                       (blank fields were intentionally left blank)   IE: assessed in long sitting  Stance Surface Eyes Initial  01/05  1/29   Single Leg Firm Ground Open R = 15 sec  L = 7 sec L= 30 seconds  R= 35 seconds        Closed        Foam Open         Closed       (blank fields were intentionally left blank)        BP: 129/73 Heart Rate: 77             Treatment     Therapeutic Exercises - Justified to address any of the following:  To develop strength, endurance, ROM and/or flexibility.   recumbent bike L3 x 6 min w/subjective discussion and to prepare for session.     Measurement of Ankle ROM    Ankle resisted DF, Inversion L LE RTB 3 sets of 10 reps each exercise     Supine HS stretch with strap 3 reps of 30 seconds hold    Calf stretch on incline board 1 x 1 minute hold with knee straight    Added: Calf stretch on incline board with knee bent 1 x 1 minute hold    B Leg Press on 1150# 3 sets of 10 reps with cueing on technique    Updated HEP    Neuromuscular Re-Education - Justified to address any of the following:   Re-education of movement, balance, coordination, kinesthetic sense, posture and/or proprioception for sitting and/or standing activities.   Rockerboard x 2 min each direction w/ vc & tactile cues to facilitate TrA, VMO/quad & glut recruitment & foot/ankle muscle co-contractions to decrease LOB during gait.    Measurement of SLS    Tandem  stance  on Airex foam pad 2 reps of 30 seconds hold each side    Manual Therapy - Justified to address any of the following:    Mobilization of joints and soft tissues, manipulation, manual lymphatic drainage, and/or manual traction.    Supine : STM to L Lateral lower leg muscles    Home Exercises   Access Code: [email or web address]  URL: [email or web address]  Date: 01/21/2023  Prepared by: Breelle Hollywood Dance movement psychotherapist on [employer]  - 1 x daily - 3 reps - 30 sec hold  - Toe Yoga - Alternating Great Toe and Lesser Toe Extension  - 1 x daily - 7 x weekly - 2 sets - 10 reps  - Hooklying Hamstring Stretch with Strap  - 1 x daily - 7 x weekly - 1 sets - 2 reps - 30 sec hold  - Seated Ankle Inversion with Resistance and Legs Crossed  - 1 x daily - 7 x weekly - 3 sets - 10 reps  - Ankle Eversion with Resistance  - 1 x daily - 7 x weekly - 3 sets - 10 reps  - Long Sitting Ankle Dorsiflexion with Anchored Resistance  - 1 x daily - 7 x weekly - 3 sets - 10 reps  - Soleus Stretch on Wall  - 1 x daily - 7 x weekly - 1 sets - 2 reps - 30 seconds hold  - Supine ITB Stretch with Strap  - 1 x daily - 7 x weekly - 1 sets - 1 reps - 30 seconds hold             Assessment   Time taken for interpretation.  Pt progressing well, on a positive slope.   Improved SLS, improving  ankle ROM noted.  Updated HEP  Patient responds well to prescribed exercises and entire PT session today. Patient requires skilled supervised out-patient physical therapy session to restore prior level of function.      Functional Limitations (PLOF): @ IE : Unable to walk >10 minutes without pain (previously able to walk 30 minutes without restrictions or pain)  Unable to stand >10 minutes to complete household chores (previously able to stand 30 minutes without restriction or pain)  Currently negotiating stairs with step to pattern, with pain (previously able to perform with step through pattern, without pain)  Unable to get out of bed in the morning without  increased pain (previously no issues)  Plan   Continue POC    POC reduced to 1x/week ,should pt continue to improve. If pt reports increased symptoms, POC to increase to 2x/week     Use translator Caprice Beaver)  Prefers private treatment room. Okay with using common area for specific exercises      Goals      Goal 1: Patient will demonstrate independence in initial HEP with proper form, sets and reps for safe completion at home in conjunction with PT POC.   Sessions: 10      Goal 2: Pt will improve B ankle DF AROM to improve gait mechanics and ability to walk 30 minutes without increased pain.     1/29: Goal met,able to walk 60 minutes now w/o increased pain SM   Sessions: 10      Goal 3: Patient will demonstrate independence in prescribed HEP with proper form, sets and reps for safe discharge to an independent program.   Sessions: 20      Goal 4: Pt will improve B DF strength to 40 lbs or greater in order to negotiate stairs with step through pattern and without pain.    Sessions: 20          Goal 5: Pt will improve single leg stance to 20 sec or greater in order to improve balance and safety when negotiating curbs / uneven surfaces in community.     01/21/2023: Goal Met SM   Sessions: [place], DPT

## 2023-02-13 ENCOUNTER — Inpatient Hospital Stay: Payer: 59

## 2023-02-16 ENCOUNTER — Telehealth: Payer: Self-pay | Admitting: Physical Therapist

## 2023-02-16 NOTE — Telephone Encounter (Signed)
Called using interpreter ID # [id number] to cancel and reschedule appointment for Thursday 02/21/2023. Pt's husband understands and aware of the next follow up appointment

## 2023-02-20 ENCOUNTER — Inpatient Hospital Stay: Payer: 59

## 2023-02-21 ENCOUNTER — Inpatient Hospital Stay: Payer: 59 | Admitting: Physical Therapist

## 2023-02-26 ENCOUNTER — Inpatient Hospital Stay: Payer: 59 | Admitting: Physical Therapist

## 2023-02-27 ENCOUNTER — Inpatient Hospital Stay: Payer: 59 | Attending: Medical

## 2023-02-27 DIAGNOSIS — M79661 Pain in right lower leg: Secondary | ICD-10-CM | POA: Insufficient documentation

## 2023-02-27 DIAGNOSIS — M25571 Pain in right ankle and joints of right foot: Secondary | ICD-10-CM | POA: Insufficient documentation

## 2023-02-27 DIAGNOSIS — M79662 Pain in left lower leg: Secondary | ICD-10-CM | POA: Insufficient documentation

## 2023-02-27 DIAGNOSIS — M25572 Pain in left ankle and joints of left foot: Secondary | ICD-10-CM | POA: Insufficient documentation

## 2023-02-27 NOTE — PT/OT Therapy Note (Signed)
Name: Jalysia Durden Age: 42 y.o.   Date of Service: 02/27/2023  Referring Physician: Dulcy Fanny*   Date of Injury: No data found 11/27/2022  PT Date Care Plan Established/Reviewed:12/28/2022  PT Date Treatment Started:12/28/2022  OT Date Care Plan Established/Reviewed: No data found  OT Date Treatment Started: No data found    (Historic) Date of Injury:No data was found  (Historic) Date Care Plan Established/Reviewed No data was found  No data was found  (Historic) Date Treatment Started No data was found No data was found    End of Certification Date: 123456  Sessions in Plan of Care: No data was found  Surgery Date: No data was found  MD Follow-up: No data was found  Medbridge Code: [email or web address]    Visit Count: 6   Diagnosis:    Diagnosis ICD-10-CM Associated Order   1. Pain in left ankle and joints of left foot  M25.572       2. Pain in right ankle and joints of right foot  M25.571       3. Pain in right lower leg  M79.661       4. Pain in left lower leg  M79.662              Subjective     History of Present Illness   History of Present Illness: @ IE : Pt presents with B foot and heel pain. The pain on the L is localized to the heel, with occasional pain into the lower leg / up to the knee. The pain on the R is localized to the lower leg and knee, denies pain in the R heel at this time. Pt reports pain, numbness, and cramping in B feet (L>R). Denies pain in lumbar spine. The pain is aggravated with walking >10 minutes, standing >5 minutes, sitting >30 minutes. The pain is always there, but the activity makes it worse. The pain is aggravated when she wakes up in the morning; her heels feel very "hard" when she gets out of bed.   The pain started about 8 months ago. No specific inciting injury or accident.   Pt reports she tried a frozen water bottle without improvement - reports trying this for six weeks.   Pt reports no change when wearing shoes compared to barefoot.   Not currently taking any pain  medication. Was taking it a few weeks ago, however it did not help so she discontinued it.      Pt reporst history of diabetes which she takes medication for. No other regular medications.        Functional Limitations (PLOF): @ IE : Unable to walk >10 minutes without pain (previously able to walk 30 minutes without restrictions or pain)  Unable to stand >10 minutes to complete household chores (previously able to stand 30 minutes without restriction or pain)  Currently negotiating stairs with step to pattern, with pain (previously able to perform with step through pattern, without pain)  Unable to get out of bed in the morning without increased pain (previously no issues)    Daily Subjective   Treatment was provided with the assistance of interpreter to accommodate limited [place] proficiency. Interpreter Name/ ID#: [id number].    Pt reports she is doing better. There is not much of difference but better. When waking up in the morning she does not have her heel pain. When walking for longer bouts she can feel it in her heal. I think the ex are goin fine with  the medicine is working.     Social Support/Occupation    Lives in: multiple level home         Occupation: not working outside of home           Precautions: diabetes  Allergies: Patient has no allergy information on record.    Objective                Posture      Ankle/Foot   Ankle/Foot (Left): Pes planus.   Ankle/Foot (Right): Pes planus.      Comments  Standing with B feet turned out     Gait   Within Functional Limits     Integumentary   no wound, lesion or rash noted     Palpation IE 12/28/2022: increased resting tissue tension and TTP in B gastroc and soleus; TTP at L>R heel and plantar surface     Neurological Testing      Sensation      Ankle/Foot   Left Ankle/Foot   Intact: light touch     Right Ankle/Foot   Intact: light touch         FOTO deferred due to language     Ankle  ROM  Initial  R 12/28/2022  AROM Initial  L 12/28/2022   AROM  01/21/2023  L A/PROM     L A/PROM    R A/PROM    L A/PROM   DF 5 5 p! 7 degrees, mild pain          PF 40 30 p!           Inv 30 30 p!           Ever 10 12 p!           1st ray Flex               1st ray Ext               (blank fields were intentionally left blank)  IE: assessed in long sitting      Ankle Strength  PSI Initial  R 12/28/2022  Initial   L 12/28/2022     R    L  02/05/2022    R    L    R    L    R    L   DF 24.2 16.0   19.2                PF 28.5 26.1                   Inv 13.0 13.1                   Ev 14.3 14.4   16.3                Toe Ext                       Quads  27.9 25.7                   HS Seated                        HS Prone                        Hip Abd                       (  blank fields were intentionally left blank)   IE: assessed in long sitting      Stance Surface Eyes Initial  01/05  1/29   Single Leg Firm Ground Open R = 15 sec  L = 7 sec L= 30 seconds  R= 35 seconds        Closed        Foam Open         Closed       (blank fields were intentionally left blank)        BP: 129/73 Heart Rate: 77             Treatment     Therapeutic Exercises - Justified to address any of the following:  To develop strength, endurance, ROM and/or flexibility.   Discussion of subjective and planning for today's treatment followed with translator's translations    Recumbent bike for warm up 6 min lvl 3    Con't below:  Supine HS stretch with strap 3 reps of 30 seconds hold    Calf stretch on stair with knee bent 1 x 1 minute hold    B Leg Press on 115# 4 sets of 10 reps with cueing on technique    Tandem stance on Airex foam pad 2 reps of 60 seconds hold each side (progressed time with add 30 sec)    Sciatic nerve glide 10 reps of 5 seconds hold     Neuromuscular Re-Education - Justified to address any of the following:   Re-education of movement, balance, coordination, kinesthetic sense, posture and/or proprioception for sitting and/or standing activities.             Manual Therapy - Justified to address any of the  following:    Mobilization of joints and soft tissues, manipulation, manual lymphatic drainage, and/or manual traction.    Supine :   MFR of L Lateral lower leg muscles  Patella mob super/inferior gr 3  Fib head PA gr 4    Home Exercises   Access Code: [email or web address]  URL: [email or web address]  Date: 01/21/2023  Prepared by: Shruti Dance movement psychotherapist on [employer]  - 1 x daily - 3 reps - 30 sec hold  - Toe Yoga - Alternating Great Toe and Lesser Toe Extension  - 1 x daily - 7 x weekly - 2 sets - 10 reps  - Hooklying Hamstring Stretch with Strap  - 1 x daily - 7 x weekly - 1 sets - 2 reps - 30 sec hold  - Seated Ankle Inversion with Resistance and Legs Crossed  - 1 x daily - 7 x weekly - 3 sets - 10 reps  - Ankle Eversion with Resistance  - 1 x daily - 7 x weekly - 3 sets - 10 reps  - Long Sitting Ankle Dorsiflexion with Anchored Resistance  - 1 x daily - 7 x weekly - 3 sets - 10 reps  - Soleus Stretch on Wall  - 1 x daily - 7 x weekly - 1 sets - 2 reps - 30 seconds hold  - Supine ITB Stretch with Strap  - 1 x daily - 7 x weekly - 1 sets - 1 reps - 30 seconds hold       ---      Flowsheet Row ---   Total Time    Timed Minutes 42 minutes   Total Time 42 minutes  Assessment   Demonstration of good carry over from last session as she is able to maintain proper balance with added time in tandem stance. No signs of pain or discomfort during and post LP. Upon palpation, moderate mm restriction of L lower soft tissue which improved significantly post MT intervention. Patient requires skilled supervised out-patient physical therapy session to restore prior level of function.      Functional Limitations (PLOF): @ IE : Unable to walk >10 minutes without pain (previously able to walk 30 minutes without restrictions or pain)  Unable to stand >10 minutes to complete household chores (previously able to stand 30 minutes without restriction or pain)  Currently negotiating stairs with step to pattern,  with pain (previously able to perform with step through pattern, without pain)  Unable to get out of bed in the morning without increased pain (previously no issues)  Plan   Continue POC    POC reduced to 1x/week ,should pt continue to improve. If pt reports increased symptoms, POC to increase to 2x/week     Use translator Caprice Beaver)  Prefers private treatment room. Okay with using common area for specific exercises      Goals      Goal 1: Patient will demonstrate independence in initial HEP with proper form, sets and reps for safe completion at home in conjunction with PT POC.   Sessions: 10      Goal 2: Pt will improve B ankle DF AROM to improve gait mechanics and ability to walk 30 minutes without increased pain.     1/29: Goal met,able to walk 60 minutes now w/o increased pain SM   Sessions: 10      Goal 3: Patient will demonstrate independence in prescribed HEP with proper form, sets and reps for safe discharge to an independent program.   Sessions: 20      Goal 4: Pt will improve B DF strength to 40 lbs or greater in order to negotiate stairs with step through pattern and without pain.    Sessions: 20          Goal 5: Pt will improve single leg stance to 20 sec or greater in order to improve balance and safety when negotiating curbs / uneven surfaces in community.     01/21/2023: Goal Met SM   Sessions: [address] LPTA

## 2023-03-04 ENCOUNTER — Inpatient Hospital Stay: Payer: 59 | Admitting: Physical Therapist

## 2023-03-11 ENCOUNTER — Telehealth: Payer: Self-pay | Admitting: Physical Therapist

## 2023-03-11 ENCOUNTER — Inpatient Hospital Stay: Payer: 59 | Admitting: Physical Therapist

## 2023-03-11 NOTE — PT/OT Therapy Note (Deleted)
Name: Alexandra Clements Age: 42 y.o.   Date of Service: 03/11/2023  Referring Physician: Dulcy Fanny*   Date of Injury: No data found No data found  PT Date Care Plan Established/Reviewed:No data found  PT Date Treatment Started:No data found  OT Date Care Plan Established/Reviewed: No data found  OT Date Treatment Started: No data found    (Historic) Date of Injury:No data was found  (Historic) Date Care Plan Established/Reviewed No data was found  No data was found  (Historic) Date Treatment Started No data was found No data was found    End of Certification Date: No data was found  Sessions in Plan of Care: No data was found  Surgery Date: No data was found  MD Follow-up: No data was found  Medbridge Code: No data was found    Visit Count: Visit count could not be calculated. Make sure you are using a visit which is associated with an episode.   Diagnosis:   No diagnosis found.         Subjective     History of Present Illness   History of Present Illness: @ IE : Pt presents with B foot and heel pain. The pain on the L is localized to the heel, with occasional pain into the lower leg / up to the knee. The pain on the R is localized to the lower leg and knee, denies pain in the R heel at this time. Pt reports pain, numbness, and cramping in B feet (L>R). Denies pain in lumbar spine. The pain is aggravated with walking >10 minutes, standing >5 minutes, sitting >30 minutes. The pain is always there, but the activity makes it worse. The pain is aggravated when she wakes up in the morning; her heels feel very "hard" when she gets out of bed.   The pain started about 8 months ago. No specific inciting injury or accident.   Pt reports she tried a frozen water bottle without improvement - reports trying this for six weeks.   Pt reports no change when wearing shoes compared to barefoot.   Not currently taking any pain medication. Was taking it a few weeks ago, however it did not help so she discontinued it.       Pt reporst history of diabetes which she takes medication for. No other regular medications.        Functional Limitations (PLOF): @ IE : Unable to walk >10 minutes without pain (previously able to walk 30 minutes without restrictions or pain)  Unable to stand >10 minutes to complete household chores (previously able to stand 30 minutes without restriction or pain)  Currently negotiating stairs with step to pattern, with pain (previously able to perform with step through pattern, without pain)  Unable to get out of bed in the morning without increased pain (previously no issues)    Daily Subjective   Treatment was provided with the assistance of interpreter to accommodate limited [place] proficiency. Interpreter Name/ ID#: [id number].    Pt reports she is doing better. There is not much of difference but better. When waking up in the morning she does not have her heel pain. When walking for longer bouts she can feel it in her heal. I think the ex are goin fine with the medicine is working.     Social Support/Occupation    Lives in: multiple level home         Occupation: not working outside of home  Precautions: No data was found  Allergies: Patient has no allergy information on record.    Objective                Posture      Ankle/Foot   Ankle/Foot (Left): Pes planus.   Ankle/Foot (Right): Pes planus.      Comments  Standing with B feet turned out     Gait   Within Functional Limits     Integumentary   no wound, lesion or rash noted     Palpation IE 12/28/2022: increased resting tissue tension and TTP in B gastroc and soleus; TTP at L>R heel and plantar surface     Neurological Testing      Sensation      Ankle/Foot   Left Ankle/Foot   Intact: light touch     Right Ankle/Foot   Intact: light touch         FOTO deferred due to language     Ankle  ROM  Initial  R 12/28/2022  AROM Initial  L 12/28/2022   AROM  01/21/2023  L A/PROM    L A/PROM    R A/PROM    L A/PROM   DF 5 5 p! 7 degrees, mild pain          PF 40 30  p!           Inv 30 30 p!           Ever 10 12 p!           1st ray Flex               1st ray Ext               (blank fields were intentionally left blank)  IE: assessed in long sitting      Ankle Strength  PSI Initial  R 12/28/2022  Initial   L 12/28/2022     R    L  02/05/2022    R    L    R    L    R    L   DF 24.2 16.0   19.2                PF 28.5 26.1                   Inv 13.0 13.1                   Ev 14.3 14.4   16.3                Toe Ext                       Quads  27.9 25.7                   HS Seated                        HS Prone                        Hip Abd                       (blank fields were intentionally left blank)   IE: assessed in long sitting      Stance Surface Eyes Initial  01/05  1/29  Single Leg Firm Ground Open R = 15 sec  L = 7 sec L= 30 seconds  R= 35 seconds        Closed        Foam Open         Closed       (blank fields were intentionally left blank)        BP: 129/73 Heart Rate: 77             Treatment     Therapeutic Exercises - Justified to address any of the following:  To develop strength, endurance, ROM and/or flexibility.   Discussion of subjective and planning for today's treatment followed with translator's translations    Recumbent bike for warm up 6 min lvl 3    Con't below:  Supine HS stretch with strap 3 reps of 30 seconds hold    Calf stretch on stair with knee bent 1 x 1 minute hold    B Leg Press on 115# 4 sets of 10 reps with cueing on technique    Tandem stance on Airex foam pad 2 reps of 60 seconds hold each side (progressed time with add 30 sec)    Sciatic nerve glide 10 reps of 5 seconds hold     Neuromuscular Re-Education - Justified to address any of the following:   Re-education of movement, balance, coordination, kinesthetic sense, posture and/or proprioception for sitting and/or standing activities.             Manual Therapy - Justified to address any of the following:    Mobilization of joints and soft tissues, manipulation, manual lymphatic  drainage, and/or manual traction.    Supine :   MFR of L Lateral lower leg muscles  Patella mob super/inferior gr 3  Fib head PA gr 4    Home Exercises   Access Code: [email or web address]  URL: [email or web address]  Date: 01/21/2023  Prepared by: Trei Schoch Dance movement psychotherapist on [employer]  - 1 x daily - 3 reps - 30 sec hold  - Toe Yoga - Alternating Great Toe and Lesser Toe Extension  - 1 x daily - 7 x weekly - 2 sets - 10 reps  - Hooklying Hamstring Stretch with Strap  - 1 x daily - 7 x weekly - 1 sets - 2 reps - 30 sec hold  - Seated Ankle Inversion with Resistance and Legs Crossed  - 1 x daily - 7 x weekly - 3 sets - 10 reps  - Ankle Eversion with Resistance  - 1 x daily - 7 x weekly - 3 sets - 10 reps  - Long Sitting Ankle Dorsiflexion with Anchored Resistance  - 1 x daily - 7 x weekly - 3 sets - 10 reps  - Soleus Stretch on Wall  - 1 x daily - 7 x weekly - 1 sets - 2 reps - 30 seconds hold  - Supine ITB Stretch with Strap  - 1 x daily - 7 x weekly - 1 sets - 1 reps - 30 seconds hold               Assessment   Demonstration of good carry over from last session as she is able to maintain proper balance with added time in tandem stance. No signs of pain or discomfort during and post LP. Upon palpation, moderate mm restriction of L lower soft tissue which improved significantly post MT intervention. Patient requires skilled supervised out-patient physical  therapy session to restore prior level of function.      Functional Limitations (PLOF): @ IE : Unable to walk >10 minutes without pain (previously able to walk 30 minutes without restrictions or pain)  Unable to stand >10 minutes to complete household chores (previously able to stand 30 minutes without restriction or pain)  Currently negotiating stairs with step to pattern, with pain (previously able to perform with step through pattern, without pain)  Unable to get out of bed in the morning without increased pain (previously no issues)  Plan   Continue  POC    POC reduced to 1x/week ,should pt continue to improve. If pt reports increased symptoms, POC to increase to 2x/week     Use translator Caprice Beaver)  Prefers private treatment room. Okay with using common area for specific exercises            Jennye Moccasin, DPT

## 2023-03-11 NOTE — Telephone Encounter (Signed)
Left voicemail to inform patient that she missed her appointment today. Pt was asked to return the call to reschedule. She was reminded of her next appointment and of the CX/NS policy.     With help of Interpreter ID # [id number]

## 2023-03-19 ENCOUNTER — Inpatient Hospital Stay: Payer: 59 | Admitting: Physical Therapist

## 2023-03-25 ENCOUNTER — Inpatient Hospital Stay: Payer: 59

## 2023-04-16 ENCOUNTER — Inpatient Hospital Stay: Payer: 59 | Admitting: Physical Therapist

## 2023-04-16 NOTE — PT/OT Therapy Note (Signed)
Discontinuation of Therapy Services    Alexandra Clements did not complete prescribed physical therapy visits.      Status is unknown at this time, physical therapy has been discontinued and patient has been discharged from care.  The last therapy note is below for review.    Please feel free to contact me with any questions regarding the care of Alexandra Clements.    Sincerely,    Ward Givens, PT        04/16/2023

## 2023-04-25 IMAGING — US US ABDOMEN LIMITED
1 series · 14 of 25 positions shown · non-contrast
Comparison: None Available.

CLINICAL DATA: Abnormal LFTs

EXAM:
ULTRASOUND ABDOMEN LIMITED RIGHT UPPER QUADRANT

[Series 1: us abdomen limited ruq (liver/gb) · 14 of 71 slices shown]
[im 1/71]
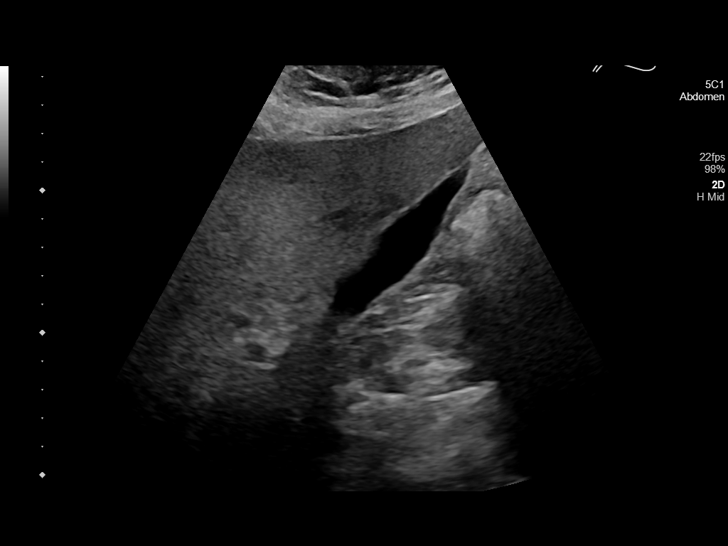
[im 6/71]
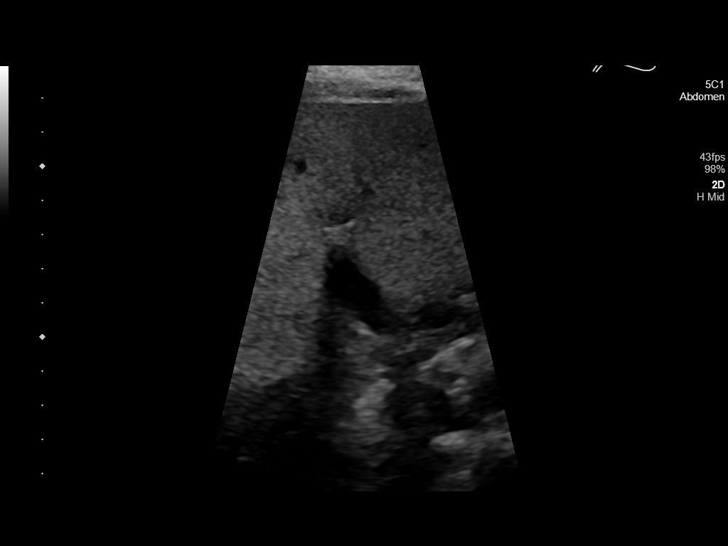
[im 12/71]
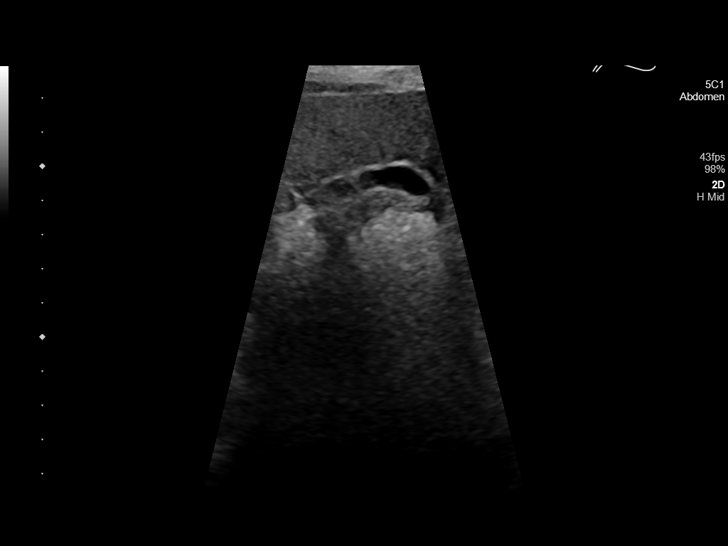
[im 18/71]
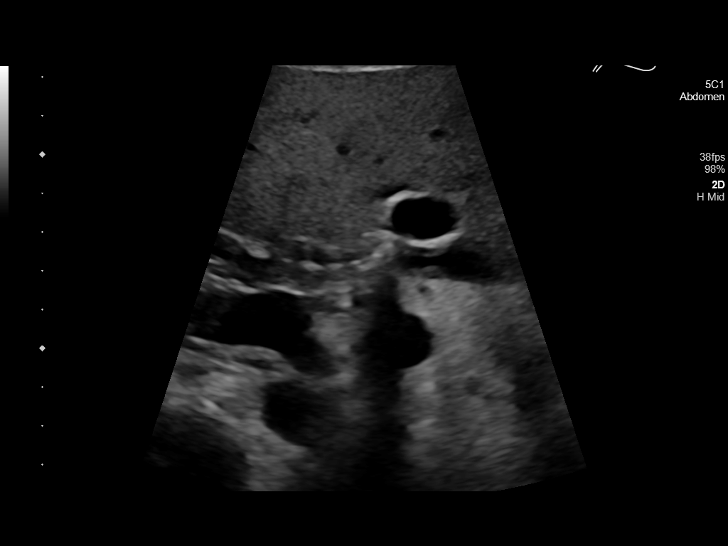
[im 24/71]
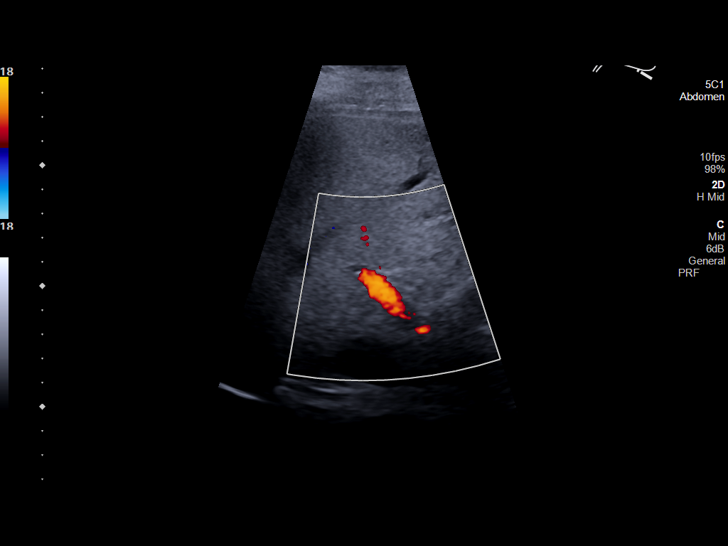
[im 27/71]
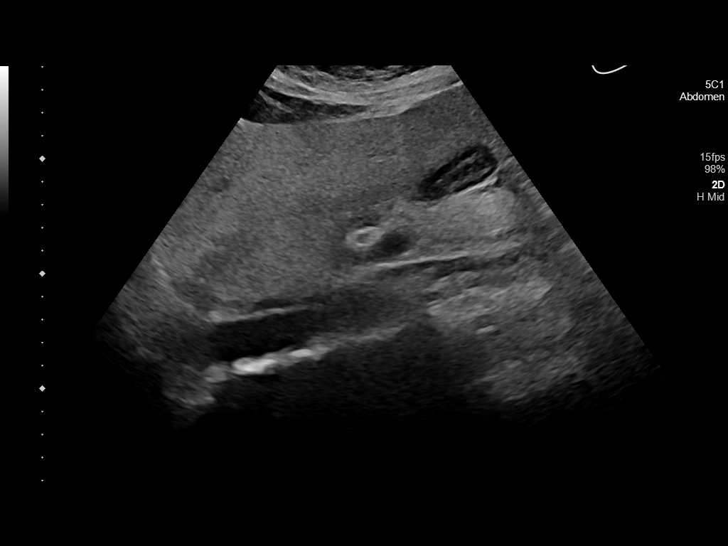
[im 33/71]
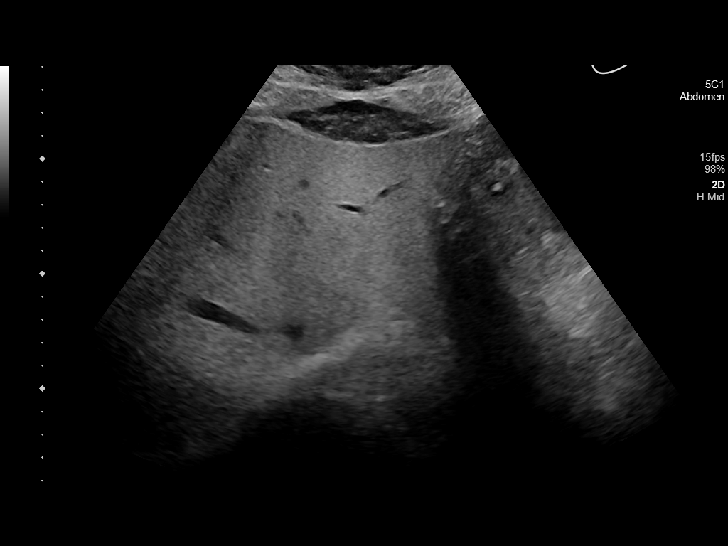
[im 38/71]
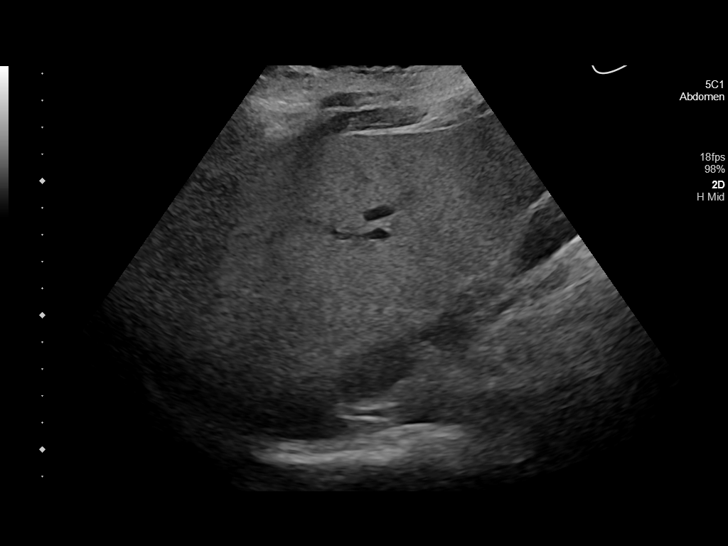
[im 44/71]
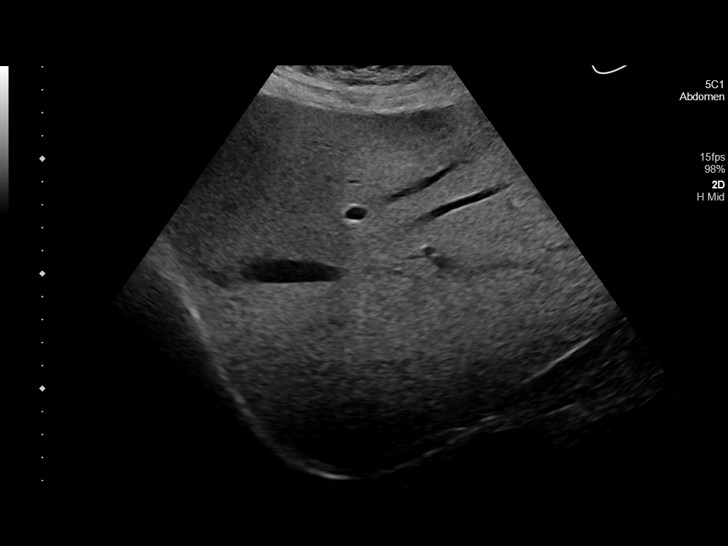
[im 47/71]
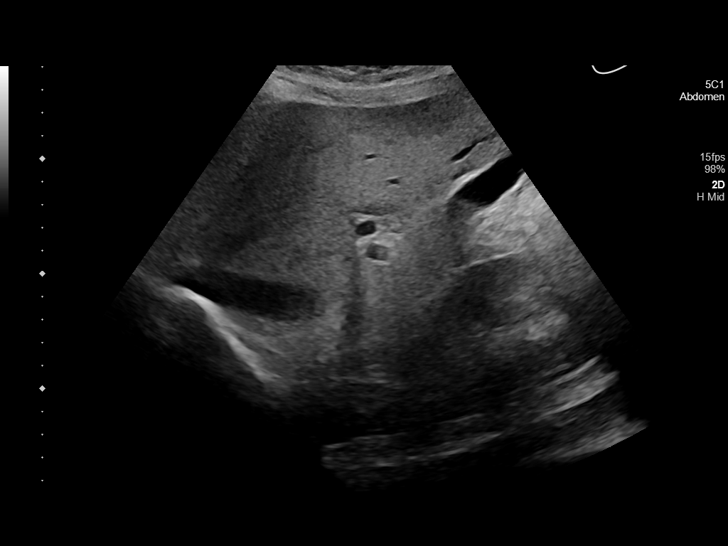
[im 53/71]
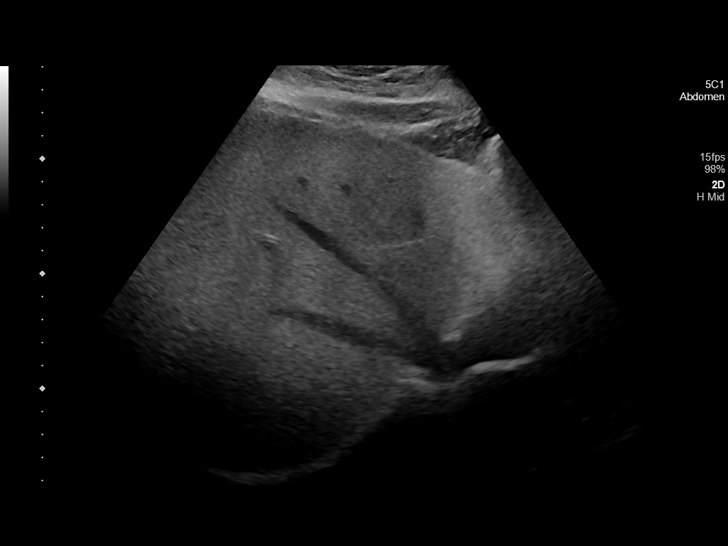
[im 59/71]
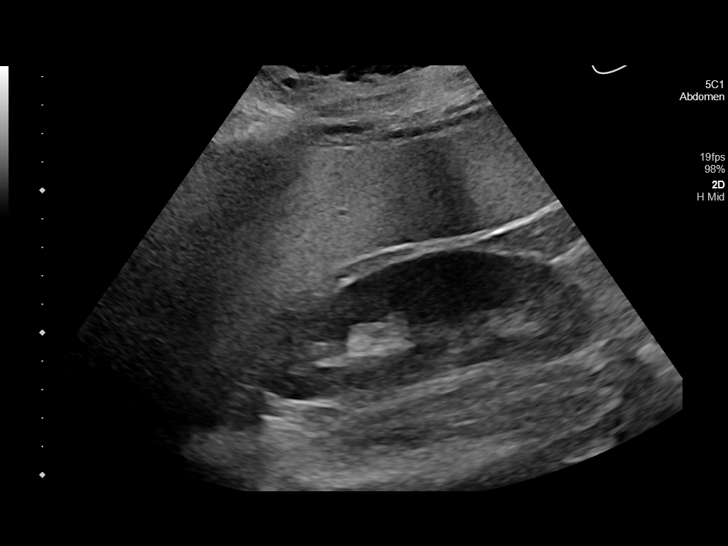
[im 65/71]
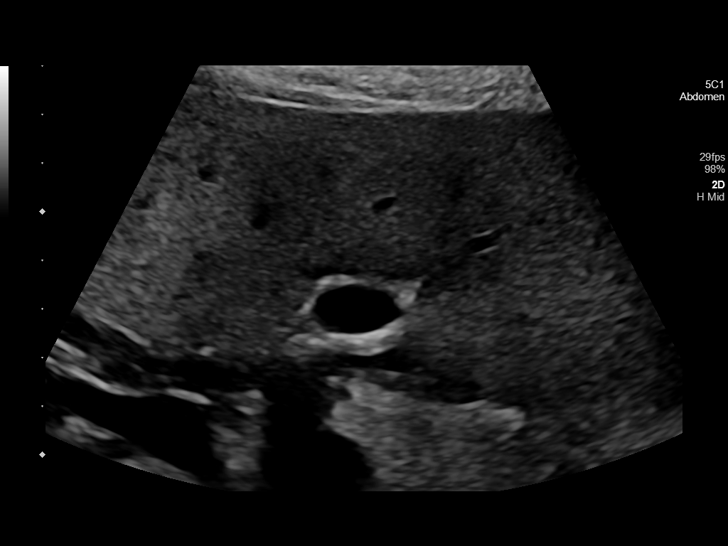
[im 71/71]
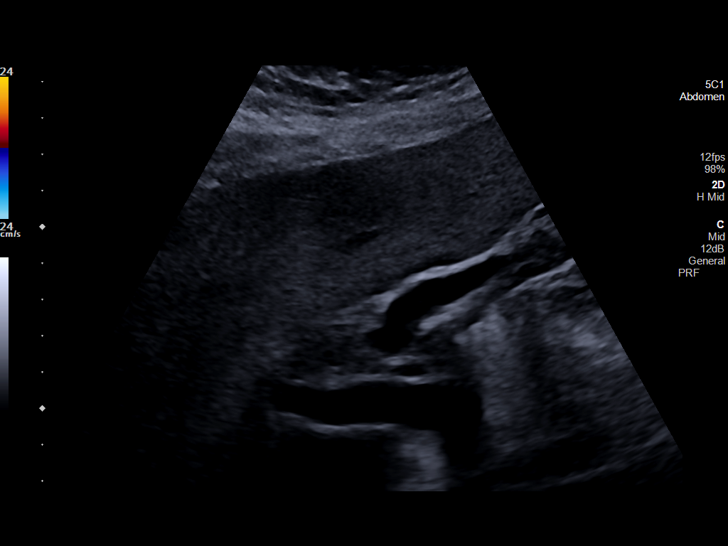

[14 of 25 positions shown; findings below may reference images not displayed]

FINDINGS: Gallbladder:

No gallstones or wall thickening visualized. No sonographic Murphy
sign noted by sonographer.

Common bile duct:

Diameter: 4 mm

Liver:

Coarse, increased echogenicity of the parenchyma with no focal mass
identified. Portal vein is patent on color Doppler imaging with
normal direction of blood flow towards the liver.

Other: None.
IMPRESSION: Abnormal appearance of the liver parenchyma which may represent
hepatic steatosis.

## 2023-11-18 ENCOUNTER — Emergency Department
Admission: EM | Admit: 2023-11-18 | Discharge: 2023-11-18 | Disposition: A | Discharge status: Home / Self Care | Payer: Medicaid Other | Attending: Emergency Medicine | Admitting: Emergency Medicine

## 2023-11-18 ENCOUNTER — Emergency Department: Payer: Medicaid Other

## 2023-11-18 DIAGNOSIS — R21 Rash and other nonspecific skin eruption: Secondary | ICD-10-CM | POA: Insufficient documentation

## 2023-11-18 DIAGNOSIS — R1032 Left lower quadrant pain: Secondary | ICD-10-CM | POA: Insufficient documentation

## 2023-11-18 DIAGNOSIS — K6389 Other specified diseases of intestine: Secondary | ICD-10-CM | POA: Insufficient documentation

## 2023-11-18 HISTORY — DX: Type 2 diabetes mellitus without complications: E11.9

## 2023-11-18 LAB — URINALYSIS WITH REFLEX TO MICROSCOPIC EXAM - REFLEX TO CULTURE
Urine Bilirubin: NEGATIVE
Urine Blood: NEGATIVE
Urine Glucose: NEGATIVE
Urine Ketones: NEGATIVE mg/dL
Urine Leukocyte Esterase: NEGATIVE
Urine Nitrite: NEGATIVE
Urine Protein: NEGATIVE
Urine Specific Gravity: 1.012 (ref 1.001–1.035)
Urine Urobilinogen: NORMAL mg/dL (ref 0.2–2.0)
Urine pH: 7 (ref 5.0–8.0)

## 2023-11-18 LAB — COMPREHENSIVE METABOLIC PANEL
ALT: 21 U/L (ref <=55)
AST (SGOT): 19 U/L (ref <=41)
Albumin/Globulin Ratio: 1.3 (ref 0.9–2.2)
Albumin: 4.2 g/dL (ref 3.5–5.0)
Alkaline Phosphatase: 78 U/L (ref 37–117)
Anion Gap: 9 (ref 5.0–15.0)
BUN: 11 mg/dL (ref 7–21)
Bilirubin, Total: 0.4 mg/dL (ref 0.2–1.2)
CO2: 27 meq/L (ref 17–29)
Calcium: 9.6 mg/dL (ref 8.5–10.5)
Chloride: 105 meq/L (ref 99–111)
Creatinine: 0.6 mg/dL (ref 0.4–1.0)
GFR: >60 mL/min/{1.73_m2} (ref >=60.0)
Globulin: 3.2 g/dL (ref 2.0–3.6)
Glucose: 119 mg/dL — ABNORMAL HIGH (ref 70–100)
Potassium: 4.6 meq/L (ref 3.5–5.3)
Protein, Total: 7.4 g/dL (ref 6.0–8.3)
Sodium: 141 meq/L (ref 135–145)

## 2023-11-18 LAB — LAB USE ONLY - CBC WITH DIFFERENTIAL
Absolute Basophils: 0.03 10*3/uL (ref 0.00–0.08)
Absolute Eosinophils: 0.09 10*3/uL (ref 0.00–0.44)
Absolute Immature Granulocytes: 0.02 10*3/uL (ref 0.00–0.07)
Absolute Lymphocytes: 2.13 10*3/uL (ref 0.42–3.22)
Absolute Monocytes: 0.56 10*3/uL (ref 0.21–0.85)
Absolute Neutrophils: 5.92 10*3/uL (ref 1.10–6.33)
Absolute nRBC: 0 10*3/uL (ref <=0.00)
Basophils %: 0.3 %
Eosinophils %: 1 %
Hematocrit: 38.1 % (ref 34.7–43.7)
Hemoglobin: 13.6 g/dL (ref 11.4–14.8)
Immature Granulocytes %: 0.2 %
Lymphocytes %: 24.3 %
MCH: 30.8 pg (ref 25.1–33.5)
MCHC: 35.7 g/dL (ref 31.5–35.8)
MCV: 86.4 fL (ref 78.0–96.0)
MPV: 10.1 fL (ref 8.9–12.5)
Monocytes %: 6.4 %
Neutrophils %: 67.8 %
Platelet Count: 260 10*3/uL (ref 142–346)
Preliminary Absolute Neutrophil Count: 5.92 10*3/uL (ref 1.10–6.33)
RBC: 4.41 10*6/uL (ref 3.90–5.10)
RDW: 12 % (ref 11–15)
WBC: 8.75 10*3/uL (ref 3.10–9.50)
nRBC %: 0 /100{WBCs} (ref <=0.0)

## 2023-11-18 LAB — SERUM HCG, QUALITATIVE: hCG Qualitative: NEGATIVE

## 2023-11-18 LAB — LIPASE: Lipase: 26 U/L (ref 8–78)

## 2023-11-18 MED ORDER — IOHEXOL 350 MG/ML IV SOLN
100.0000 mL | Freq: Once | INTRAVENOUS | Status: AC | PRN
Start: 2023-11-18 — End: 2023-11-18
  Administered 2023-11-18: 100 mL via INTRAVENOUS

## 2023-11-18 MED ORDER — TRIAMCINOLONE ACETONIDE 0.1 % EX OINT
TOPICAL_OINTMENT | Freq: Two times a day (BID) | CUTANEOUS | 0 refills | Status: AC
Start: 2023-11-18 — End: ?

## 2023-11-18 MED ORDER — KETOROLAC TROMETHAMINE 30 MG/ML IJ SOLN
30.0000 mg | Freq: Once | INTRAMUSCULAR | Status: AC
Start: 2023-11-18 — End: 2023-11-18
  Administered 2023-11-18: 30 mg via INTRAVENOUS
  Filled 2023-11-18: qty 1

## 2023-11-18 NOTE — Discharge Instructions (Signed)
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  This is not dangerous.  Please take Motrin 600 mg over-the-counter with meals.  Apply cool compresses desired.  Symptoms should improve in the next several days.    You may take your topical steroid cream over the next several days.  Please follow-up with the dermatologist if no significant improvement.         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       An epiploic appendage is an extra extension of normal tissue present on the surface of the colon, or the large intestine. This extra tissue is filled with fat and covered by the normal lining of the colon. It also holds arteries and veins that supply blood to that part of the colon.  Normally, everyone has about 50 to 100 of these epiploic appendages. Most of them are in the lower part of the colon.  You have epiploic appendagitis when the blood vessels to these appendages get twisted. This is most common with the veins. When this happens, the appendage loses its blood supply.  When these blood vessels get very twisted, you might feel pain in your belly. You might also vomit (throw up) or feel nauseated (sick to your stomach).  While this is painful, epiploic appendagitis is not dangerous or life-threatening to you. The main blood supply to the colon is still working.      A similar problem can happen when your omentum twists on itself and gets kinked. Omentum is the fatty tissue that keeps your intestines together.   When this happens, the blood supply to your colon can get blocked. Like with epiploic appendagitis, this can cause belly pain. You might also vomit (throw up) or feel nauseated (sick to your stomach).  This is called an omental infarct.  This also gets better on its own most of the time.      Both epiploic appendagitis and omental infarcts can feel similar to appendicitis or diverticulitis.  To check for  epiploic appendagitis or omental infarcts, your doctor will do image scans of your belly. Most often, you will get a CT ("CAT") scan or an ultrasound. With time, epiploic appendagitis and omental infarcts most often get better by themselves. You will only need medicine for the pain.  However, if the symptoms don't go away, you might need surgery. This is rare, though.      Take your pain medicine as your doctor tells you to. Follow up with your primary doctor or a general surgeon.     Though we don't believe your condition is serious right now, it is important to be careful. Sometimes a problem that seems mild can become serious later. This is why it is very important that you return here or go to the nearest Emergency Department if you are not improving or your symptoms are getting worse.     YOU SHOULD SEEK MEDICAL ATTENTION IMMEDIATELY, EITHER HERE OR AT THE NEAREST EMERGENCY DEPARTMENT, IF ANY OF THE FOLLOWING OCCUR:     You are unable to keep any foods down.    You have a fever (temperature higher than 100.8F or 38C).   You have a lot of belly pain, and it is worse than it was before.      If you can't follow up with your doctor, or if at any time you feel you need to be rechecked or seen again, come back  here or go to the nearest emergency department.        Rash, Nonspecific     You have been seen today for a rash.     The rash does not have a specific appearance or cause that would let the medical staff give a definite diagnosis or treatment now.     There are many causes for rashes to appear on the skin. The medical staff will have talked about some of the many causes. Some causes may be: Allergic reactions, chemical irritation of the skin or infections. A rash alone with no other symptoms is rarely harmful.     If you have some itching you may try an oral (by mouth) antihistamine like diphenhydramine (Benadryl). This is available over the counter (without prescription). Follow the directions and  precautions on the medicine packaging.     Follow up with your family doctor or clinic for reevaluation of the rash if it does not clear up in two or three days.     Sometimes, you will need to see a Dermatologist (a skin specialist doctor) for help finding the cause of the rash.     It is OK for you to go home now.     Even though it may be hard, try not to scratch the affected area. A cool wet cloth can help relieve itching and keep you from scratching.     YOU SHOULD SEEK MEDICAL ATTENTION IMMEDIATELY, EITHER HERE OR AT THE NEAREST EMERGENCY DEPARTMENT, IF ANY OF THE FOLLOWING OCCURS:  Rash spreads or gets worse.  Severe itching, pain or burning in the skin.  Blistering, bruising or bleeding skin.  Fever (temperature higher than 100.73F / 38C), headache, vomiting (throwing up).  Any new symptoms which are of concern to you.  Any signs of infection in the skin develop. This includes more redness, pain, pus drainage, fevers (temperature higher than 100.73F / 38C) or swelling.

## 2023-11-18 NOTE — ED Triage Notes (Signed)
Patient c/o LLQ abdominal pain, denies n/v/constipation/diarrhea/vaginal bleeding/hematuria/dysuria. Denies history of LLQ abdominal pain.

## 2023-11-18 NOTE — ED Provider Notes (Signed)
History     Chief Complaint   Patient presents with    Abdominal Pain     Patient presents to the emergency department complaining of 2 days of left lower quadrant abdominal pain.  No significant constipation, diarrhea, change in appetite.  No vaginal bleeding or discharge.  No urinary symptoms.  No fevers or chills.  Has been endorses a history of hysterectomy sparing ovaries.  No known history of ovarian cysts or kidney stones    The history is provided by the patient and the spouse. The history is limited by a language barrier. A language interpreter was used (iPad interpreter).   Abdominal Pain  Associated symptoms: no chest pain, no constipation, no cough, no diarrhea, no dysuria, no fever, no nausea, no shortness of breath, no sore throat and no vomiting         Medical History[1]    Past Surgical History[2]    Family History[3]    Social  Social History[4]    .     Allergies[5]    Home Medications       Med List Status: Complete Set By: Gertie Baron, RN at 11/18/2023  2:48 PM              metFORMIN (GLUCOPHAGE) 500 MG tablet     Take 1 tablet (500 mg) by mouth 2 (two) times daily with meals             Review of Systems   Constitutional:  Positive for activity change. Negative for diaphoresis and fever.   HENT:  Negative for congestion, ear pain and sore throat.    Eyes:  Negative for pain and visual disturbance.   Respiratory:  Negative for cough, chest tightness, shortness of breath and wheezing.    Cardiovascular:  Negative for chest pain, palpitations and leg swelling.   Gastrointestinal:  Positive for abdominal pain. Negative for constipation, diarrhea, nausea and vomiting.   Genitourinary:  Negative for dysuria and urgency.   Musculoskeletal:  Negative for back pain, gait problem and neck pain.   Skin:  Negative for color change and rash.   Neurological:  Negative for dizziness, weakness and headaches.   Psychiatric/Behavioral:  Negative for confusion. The patient is not nervous/anxious.     All other systems reviewed and are negative.      Physical Exam    BP: 123/59, Heart Rate: 75, Temp: 98.1 F (36.7 C), Resp Rate: 16, SpO2: 98 %, Weight: 85.3 kg    Physical Exam  Vitals and nursing note reviewed.   Constitutional:       General: She is not in acute distress.     Appearance: She is well-developed. She is not ill-appearing, toxic-appearing or diaphoretic.   HENT:      Head: Normocephalic and atraumatic.      Right Ear: External ear normal.      Left Ear: External ear normal.      Mouth/Throat:      Pharynx: No oropharyngeal exudate.   Eyes:      General: No scleral icterus.        Right eye: No discharge.         Left eye: No discharge.      Conjunctiva/sclera: Conjunctivae normal.      Pupils: Pupils are equal, round, and reactive to light.   Neck:      Vascular: No JVD.      Trachea: No tracheal deviation.   Cardiovascular:      Rate  and Rhythm: Normal rate and regular rhythm.      Pulses: Normal pulses.      Heart sounds: Normal heart sounds, S1 normal and S2 normal. No murmur heard.  Pulmonary:      Effort: Pulmonary effort is normal. No respiratory distress.      Breath sounds: Normal breath sounds. No wheezing.   Chest:      Chest wall: No tenderness.   Abdominal:      General: Bowel sounds are normal. There is no distension.      Palpations: Abdomen is soft.      Tenderness: There is no abdominal tenderness. There is no guarding.          Comments: Left lower quadrant abdominal pain with reproducible tenderness.  No significant guarding rebound or distention.  No peritoneal signs.   Musculoskeletal:         General: No tenderness. Normal range of motion.      Cervical back: Normal, full passive range of motion without pain, normal range of motion and neck supple.      Thoracic back: Normal.      Lumbar back: Normal.   Skin:     General: Skin is warm and dry.      Coloration: Skin is not pale.   Neurological:      Mental Status: She is alert and oriented to person, place, and time.      GCS:  GCS eye subscore is 4. GCS verbal subscore is 5. GCS motor subscore is 6.      Coordination: Coordination normal.   Psychiatric:         Behavior: Behavior normal.         Judgment: Judgment normal.           MDM and ED Course     ED Medication Orders (From admission, onward)      None               Medical Decision Making    I, Adelene Idler, Physician Assistant, have been the primary provider for this pt during their ED stay.     The attending signature signifies review and agreement of the history, physical examination, evaluation, clinical impression and plan except as noted.     02 sat is 98% on ra, which is nml.         Amount and/or Complexity of Data Reviewed  Labs: ordered.  Radiology: ordered.                     Procedures    Clinical Impression & Disposition     Clinical Impression  Final diagnoses:   None        ED Disposition       None             New Prescriptions    No medications on file                     [1]   Past Medical History:  Diagnosis Date    Diabetes mellitus    [2] History reviewed. No pertinent surgical history.  [3] No family history on file.  [4]   Social History  Tobacco Use    Smoking status: Never    Smokeless tobacco: Never   [5] No Known Allergies

## 2023-11-18 NOTE — ED Provider Notes (Signed)
ATTENDING : I PERFORMED A SUBSTANTIVE PORTION OF THE MEDICAL DECISION MAKING ON THE PATIENT. For the problems addressed, I developed, reviewed, or approved the plan and assessment as documented by the APP. I am also co-signing the APP Chart.  Please review additional APP chart for more information.     Vital Signs: Reviewed the patient's vital signs.     Number and Complexity of Problems Addressed (select at least one)    Complexity: High: 1 acute or chronic illness or injury that poses a threat to life or bodily function      Presenting acute complaint/pertinent PMHx/Plan: Abdominal Pain    Alexandra Clements is a 42 y.o. female  with acute complaint of llq abd pain.        Medical History[1]  ______________________________________________________________________  Amount/complexity of Data Reviewed: Independent EKG Visualization and Interpretation, Independent Radiological Studies Visualization and Interpretation, Consultants called.  L\  ED Course as of 11/18/23 1836   Mon Nov 18, 2023   1655 Images of radiological study independently visualized and interpreted by me - Dr. Helayne Seminole  Interpretation by ED physician: ct a/p : neg perforation.   [PV]      ED Course User Index  [PV] Helayne Seminole, MD       Labs and Radiology Studies:  Independently interpreted AVAILABLE laboratory tests.     Results       Procedure Component Value Units Date/Time    Comprehensive Metabolic Panel [[id number]]  (Abnormal) Collected: 11/18/23 1518    Specimen: Blood, Venous Updated: 11/18/23 1546     Glucose 119 mg/dL      BUN 11 mg/dL      Creatinine 0.6 mg/dL      Sodium 621 mEq/L      Potassium 4.6 mEq/L      Chloride 105 mEq/L      CO2 27 mEq/L      Calcium 9.6 mg/dL      Anion Gap 9.0     GFR >60.0 mL/min/1.73 m2      AST (SGOT) 19 U/L      ALT 21 U/L      Alkaline Phosphatase 78 U/L      Albumin 4.2 g/dL      Protein, Total 7.4 g/dL      Globulin 3.2 g/dL      Albumin/Globulin Ratio 1.3     Bilirubin, Total 0.4 mg/dL     Lipase  [[id number]]  (Normal) Collected: 11/18/23 1518    Specimen: Blood, Venous Updated: 11/18/23 1546     Lipase 26 U/L     Serum Beta HCG Qualitative [[id number]]  (Normal) Collected: 11/18/23 1518    Specimen: Blood, Venous Updated: 11/18/23 1538     hCG Qualitative Negative    Urinalysis with Reflex to Microscopic Exam and Culture [[id number]]  (Normal) Collected: 11/18/23 1519    Specimen: Urine, Clean Catch Updated: 11/18/23 1531     Urine Color Straw     Urine Clarity Clear     Urine Specific Gravity 1.012     Urine pH 7.0     Urine Leukocyte Esterase Negative     Urine Nitrite Negative     Urine Protein Negative     Urine Glucose Negative     Urine Ketones Negative mg/dL      Urine Urobilinogen Normal mg/dL      Urine Bilirubin Negative     Urine Blood Negative    Narrative:      Urine Microscopic not indicated  CBC with Differential (Order) [[id number]] Collected: 11/18/23 1518    Specimen: Blood, Venous Updated: 11/18/23 1529    Narrative:      The following orders were created for panel order CBC with Differential (Order).  Procedure                               Abnormality         Status                     ---------                               -----------         ------                     CBC with Differential (C.Marland KitchenMarland Kitchen[[id number]]                      Final result                 Please view results for these tests on the individual orders.    CBC with Differential (Component) [[id number]] Collected: 11/18/23 1518    Specimen: Blood, Venous Updated: 11/18/23 1529     WBC 8.75 x10 3/uL      Hemoglobin 13.6 g/dL      Hematocrit 08.6 %      Platelet Count 260 x10 3/uL      MPV 10.1 fL      RBC 4.41 x10 6/uL      MCV 86.4 fL      MCH 30.8 pg      MCHC 35.7 g/dL      RDW 12 %      nRBC % 0.0 /100 WBC      Absolute nRBC 0.00 x10 3/uL      Preliminary Absolute Neutrophil Count 5.92 x10 3/uL      Neutrophils % 67.8 %      Lymphocytes % 24.3 %      Monocytes % 6.4 %      Eosinophils % 1.0 %      Basophils % 0.3 %       Immature Granulocytes % 0.2 %      Absolute Neutrophils 5.92 x10 3/uL      Absolute Lymphocytes 2.13 x10 3/uL      Absolute Monocytes 0.56 x10 3/uL      Absolute Eosinophils 0.09 x10 3/uL      Absolute Basophils 0.03 x10 3/uL      Absolute Immature Granulocytes 0.02 x10 3/uL     Urine [employer] Tube [[id number]] Collected: 11/18/23 1519    Specimen: Urine, Clean Catch Updated: 11/18/23 1527            Radiology Results (24 Hour)       Procedure Component Value Units Date/Time    CT Abd/Pelvis with IV Contrast [[id number]] Collected: 11/18/23 1608    Order Status: Completed Updated: 11/18/23 1615    Narrative:      HISTORY: Left lower quadrant tenderness    COMPARISON: Ultrasound dated 11/22/2022.    TECHNIQUE: CT of the abdomen and pelvis performed with intravenous  contrast. The following dose reduction techniques were utilized: automated  exposure control and/or adjustment of the mA and/or KV according to patient  size, and the use of an  iterative reconstruction technique.    CONTRAST: iohexol (OMNIPAQUE) 350 MG/ML injection 100 mL    FINDINGS:  Dependent atelectasis at the lung bases. Heart is mildly enlarged. Trace  pericardial effusion    Liver gallbladder spleen adrenal glands pancreas and kidneys are normal.  There is no biliary dilatation. Portal vein is patent.    There is no bowel obstruction. Normal appendix is visualized. Mild  thickening of the descending and rectosigmoid colon, likely due to  underdistention.    There is an elliptical fatty lesion in adjacent to the distal descending  colon with pericolonic inflammatory changes, consistent with epiploica  appendagitis.    Patient status post hysterectomy. Trace free pelvic fluid is present.  Physiologic follicles are present in the left ovary. Bladder is mildly  distended.    Aorta is normal in caliber. No adenopathy. Visualized osseous structures  are unremarkable.        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  2.Other findings as above.    Jasmine December D'Heureux, MD  11/18/2023 4:13 PM          __________________________________________________________________      ED medications given:  ED Medication Orders (From admission, onward)      Start Ordered     Status Ordering Provider    11/18/23 1629 11/18/23 1628  ketorolac (TORADOL) injection 30 mg  Once        Route: Intravenous  Ordered Dose: 30 mg       Last MAR action: Given DICK, IAN E    11/18/23 1602 11/18/23 1602  iohexol (OMNIPAQUE) 350 MG/ML injection 100 mL  IMG once as needed        Route: Intravenous  Ordered Dose: 100 mL       Last MAR action: Imaging Agent Given Helayne Seminole            Prescriptions:  Discharge Medication List as of 11/18/2023  4:30 PM        START taking these medications    Details   triamcinolone (KENALOG) 0.1 % ointment Apply topically 2 (two) times daily, Starting Mon 11/18/2023, Print             Disposition:  ED Disposition       ED Disposition   Discharge    Condition   --    Date/Time   Mon Nov 18, 2023  4:30 PM    Comment   Prudy Feeler discharge to home/self care.    Condition at disposition: Stable                 Diagnosis:  Final diagnoses:   LLQ abdominal pain   Epiploic appendagitis   Rash          [1]   Past Medical History:  Diagnosis Date    Diabetes mellitus         Helayne Seminole, MD  11/18/23 1836

## 2023-11-19 LAB — LAB USE ONLY - URINE GRAY CULTURE HOLD TUBE

## 2023-11-25 NOTE — Progress Notes (Signed)
[facility]  [address], [address]  [place], [place] [address]    New Patient GYN Visit    Chief Complaint: Gynecologic Exam (NP, pelvic pain.)      History of Present Illness: Alexandra Clements is a 42 y.o. G13P0 female presenting for abdominal pain. Presents with husband and son via telephone who are helping to interpret per patient preference. Formal medical interpreter via telephone offered x2 and declined.      Patient's last menstrual period was 11/06/2023 (exact date).     She was seen in the ED on 11/25 for LLQ abdominal pain. Received toradol and CT scan revealed suspected epiploica appendagitis. CT noted s/p hysterectomy and physiological ovarian follicles with trace free pelvic fluid. The patient was concerned something may be wrong with her ovaries. She has been experiencing vaginal burning, dryness, and itching lately. No bleeding or discharge. Denies dysuria. Thinks she may have an infection.     History of hysterectomy in ~October 2023 for "inflammation" and heavy menses. Pathology benign per patient. No bleeding since hysterectomy. Normal pap x1 prior to hyst.     She is sexually active. Female partner. Notes pain with intercourse.     Never had a colonoscopy in the past.     Gynecologic History:   No h/o STDs  No h/o abnormal pap smears  Gender of Partners: male     Obstetric History:   OB History   Gravida Para Term Preterm AB Living   5             SAB IAB Ectopic Multiple Live Births                  # Outcome Date GA Lbr Len/2nd Weight Sex Type Anes PTL Lv   5 Gravida            4 Gravida            3 Gravida            2 Gravida            1 [place]                Past Medical History:  Medical History[1]        Past Surgical History:  Past Surgical History[2]    Social History:  Social History[3]     Family History:  Family History[4]    Allergies: Allergies[5]    Medications: Current Medications[6]    Review of Systems:  General ROS: negative   HEENT: negative   Breast ROS:  negative  Gastrointestinal ROS: negative  Genitourinary ROS: see above   Skin: negative  Musculoskeletal: negative  Psych: negative      Physical Exam:   BP 110/57 (BP Site: Left arm, Patient Position: Sitting, Cuff Size: Large)   Pulse 64   Temp 97.9 F (36.6 C) (Oral)   Resp 12   Ht 5' 1.8" (1.57 m)   Wt 189 lb (85.7 kg)   LMP 11/06/2023 (Exact Date)   BMI 34.79 kg/m     General appearance - alert, well appearing, and in no distress, obese  CV - regular rate  Respiratory - normal breathing pattern  Neurologic: mental status: alert and oriented x 3  Abdomen - soft, nondistended, no masses, mild TTP in RLQ  Ext: nontender, no edema  Pelvic exam:  VULVA: normal appearing vulva with no masses, tenderness or lesions, normal clitoris  VAGINA: normal appearing vagina with normal color and  discharge, no lesions  CERVIX: surgically absent  Bimanual exam deferred  RECTAL: normal external    Physical Exam    Assessment:  1. Pelvic pain in female    2. S/P hysterectomy    3. Vaginal pruritus      Plan:  - Pap smear with HPV co-testing no longer needed s/p total hysterectomy.  - GC/CT and vaginitis swabs collected. Will defer treatment until results as no significant discharge noted today  - Discussed vaginal moisturizers for pain and dryness with intercourse.   - All questions answered.    - Follow up yearly for annual examination.     Orders Placed This Encounter   Procedures    Chlamydia trachomatis, Neisseria gonorrhea and Trichomonas vaginalis, PCR    Vaginal Bacterial vaginosis, PCR    Vaginal Candida spp., PCR         Shari Heritage, MD         [1]   Past Medical History:  Diagnosis Date    Diabetes mellitus     Liver fibrosis    [2]   Past Surgical History:  Procedure Laterality Date    HYSTERECTOMY      total.   [3]   Social History  Tobacco Use    Smoking status: Never    Smokeless tobacco: Never   Substance Use Topics    Alcohol use: Never    Drug use: Never   [4]   Family History  Problem Relation Name Age  of Onset    Diabetes Mother      Hypertension Father      Diabetes Father     [5] No Known Allergies  [6]   Current Outpatient Medications   Medication Sig Dispense Refill    metFORMIN (GLUCOPHAGE) 500 MG tablet Take 1 tablet (500 mg) by mouth 2 (two) times daily with meals      triamcinolone (KENALOG) 0.1 % ointment Apply topically 2 (two) times daily 15 g 0    fluconazole (DIFLUCAN) 150 MG tablet Take 1 tablet (150 mg) by mouth once for 1 dose 1 tablet 0     No current facility-administered medications for this visit.

## 2023-11-26 ENCOUNTER — Ambulatory Visit (FREE_STANDING_LABORATORY_FACILITY): Payer: 59 | Admitting: Residents

## 2023-11-26 ENCOUNTER — Encounter (INDEPENDENT_AMBULATORY_CARE_PROVIDER_SITE_OTHER): Payer: Self-pay | Admitting: Residents

## 2023-11-26 VITALS — BP 110/57 | HR 64 | Temp 97.9°F | Resp 12 | Ht 61.8 in | Wt 189.0 lb

## 2023-11-26 DIAGNOSIS — Z90710 Acquired absence of both cervix and uterus: Secondary | ICD-10-CM

## 2023-11-26 DIAGNOSIS — N898 Other specified noninflammatory disorders of vagina: Secondary | ICD-10-CM

## 2023-11-26 DIAGNOSIS — R102 Pelvic and perineal pain: Secondary | ICD-10-CM

## 2023-11-26 NOTE — Patient Instructions (Signed)
Vaginal moisturizers and lubricants    Revaree  over the counter hyaluronic acid for treatment of vaginal dryness.  [employer] is the company who makes     Product (manufacturer) Ingredients Notes   Moisturizers   Replens Water, carbomer, polycarbophil, paraffin, hydrogenated palm oil, glyceride, sorbic acid, and sodium hydroxide Should be used 3 times weekly   Me AgainT Water, carbomer, aloe, citric acid, chlorhexidine deglutinate, sodium benzoate, potassium sorbate, diazolidinyl urea, and sorbic acid     Vagisil Feminine Moisturizer Water, glycerin, propylene glycol, poloxamer 407, methylparaben, polyquaternium-32, propylparaben, chamomile, and aloe     Feminease Water, mineral oil, glycerin, yerba santa, cetyl alcohol, and methyl paraben Yerba santa (Eriodictyon spp), a plant native to the [place], is used as a moisturizer in place of aloe   K-Y SILK-E Water, propylene glycol, sorbitol, polysorbate 60, hydroxyethylcellulose, benzoic acid, methylparaben, tocopherol, and aloe     Lubricants   Water-based   Slippery Stuff Water, polyoxyethylene, methylparaben, propylene glycol, isopropynol     Astroglide Water, glycerin, methylparaben, propylparaben, polypropylene glycol, polyquaternium, hydroxyethylcellulose, and sodium benzoate Also sold in a glycerin-free and paraben-free formulation   K-Y Jelly Water, glycerin, hydroxyethylcellulose, parabens, and chlorhexidine     Pre-Seed Water, hydroxyethylcellulose, arabinogalactan, paraben, and Pluronic copolymers Promoted to women and their partners who are trying to conceive   Silicone-based   ID Millennium Cyclomethicone, dimethicone, and dimethiconol Less drying than other lubricants   Pjur Eros Cyclopentasiloxane, dimethicone, and dimethiconol Compatible with a condom   PinkT Dimethicone, vitamin E, aloe vera, dimethiconol, and cyclomethicone Compatible with a condom   Oil-based   Elgance Women's Lubricant Natural oils Does not contain alcohol,  glycerin, or parabens; is not compatible with a condom

## 2023-11-27 ENCOUNTER — Other Ambulatory Visit: Payer: Self-pay | Admitting: Residents

## 2023-11-27 LAB — VAGINAL BACTERIAL VAGINOSIS, PCR: Bacterial vaginosis marker DNA: NOT DETECTED

## 2023-11-27 LAB — VAGINAL CANDIDA SPP., PCR
Candida glabrata DNA: NOT DETECTED
Candida group DNA: DETECTED — AB
Candida krusei DNA: NOT DETECTED

## 2023-11-27 LAB — CHLAMYDIA TRACHOMATIS, NEISSERIA GONORRHEA AND TRICHOMONAS VAGINALIS, PCR
Chlamydia trachomatis DNA: NEGATIVE
Neisseria gonorrhoeae DNA: NEGATIVE
Trichomonas vaginalis DNA: NEGATIVE

## 2023-11-27 MED ORDER — FLUCONAZOLE 150 MG PO TABS
150.0000 mg | ORAL_TABLET | Freq: Once | ORAL | 0 refills | Status: AC
Start: 2023-11-27 — End: 2023-11-27

## 2024-02-04 ENCOUNTER — Other Ambulatory Visit: Payer: Self-pay | Admitting: Internal Medicine

## 2024-03-23 ENCOUNTER — Ambulatory Visit (INDEPENDENT_AMBULATORY_CARE_PROVIDER_SITE_OTHER): Payer: 59 | Admitting: Residents
